# Patient Record
Sex: Male | Born: 1990 | Race: White | Hispanic: No | Marital: Single | Smoking: Never smoker
Health system: Southern US, Community
[De-identification: ages and names within clinical notes are randomized; demographics above are authoritative.]

## PROBLEM LIST (undated history)

## (undated) DIAGNOSIS — J45909 Unspecified asthma, uncomplicated: Secondary | ICD-10-CM

## (undated) DIAGNOSIS — Z8709 Personal history of other diseases of the respiratory system: Principal | ICD-10-CM

## (undated) HISTORY — DX: Personal history of other diseases of the respiratory system: Z87.09

## (undated) HISTORY — DX: Unspecified asthma, uncomplicated: J45.909

---
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
## 2010-10-08 ENCOUNTER — Encounter
Admission: RE | Admit: 2010-10-08 | Discharge: 2010-10-08 | Payer: Self-pay | Source: Home / Self Care | Attending: Allergy and Immunology | Admitting: Allergy and Immunology

## 2011-03-08 NOTE — H&P (Signed)
Alexander Allison, SERMON           ACCOUNT NO.:  [id number]   MEDICAL RECORD NO.:  [id number]          PATIENT TYPE:  INP   LOCATION:  1823                         FACILITY:  [facility]   PHYSICIAN:  Salvatore Decent. Cornelius Moras, M.D. DATE OF BIRTH:  Sep 11, 1991   DATE OF ADMISSION:  03/06/2005  DATE OF DISCHARGE:                                HISTORY & PHYSICAL   PRESENTING CHIEF COMPLAINT:  Shortness of breath.   HISTORY OF PRESENT ILLNESS:  The patient is a 20 year old healthy white male  who developed sudden onset of shortness of breath associated with left-sided  chest pain approximately 1 week ago. His symptoms have persisted despite  medical treatment for presumed allergies. The patient presented to the  emergency room today where chest x-ray was performed demonstrating 25-30%  left pneumothorax. The patient has no previous history of spontaneous  pneumothorax. The patient denies any recent history of trauma.   PAST MEDICAL HISTORY:  Asthma.   PAST SURGICAL HISTORY:  Tonsillectomy and adenoidectomy.   FAMILY HISTORY:  Noncontributory.   MEDICATIONS PRIOR TO ADMISSION:  Advair discus twice daily.  He has received  a Prednisone dose pack taper this week.   DRUG ALLERGIES:  SULFA.   PHYSICAL EXAM:  The patient is a healthy well-appearing white male in no  acute distress. Vital signs are stable. HEENT exam is unremarkable. The neck  is supple. There is no adenopathy. Breath sounds are mildly diminished on  the left side. No wheezes or rhonchi are noted. Cardiovascular exam is  notable for regular rate and rhythm. No murmurs or rubs are noted. The  abdomen is soft, nontender. The extremities warm and well-perfused.  Remainder of his physical exam is noncontributory.   DIAGNOSTIC TESTS:  Chest x-ray obtained today at [facility] was notable for a left 30% pneumothorax. No other abnormalities are  noted.   IMPRESSION:  Left spontaneous pneumothorax.   PLAN:  Chest tube  placement and hospital admission. I have discussed options  at length with Mr. Levinson and his parents. All their questions have been  addressed.      CHO/MEDQ  D:  03/06/2005  T:  03/06/2005  Job:  [id number]   cc:   Maryellen Pile, M.D.   Eileen Stanford, M.D.

## 2011-03-08 NOTE — Op Note (Signed)
Alexander Allison, Alexander Allison           ACCOUNT NO.:  [id number]   MEDICAL RECORD NO.:  [id number]          PATIENT TYPE:  INP   LOCATION:  2010                         FACILITY:  [facility]   PHYSICIAN:  Salvatore Decent. Cornelius Moras, M.D. DATE OF BIRTH:  May 02, 1991   DATE OF PROCEDURE:  03/06/2005  DATE OF DISCHARGE:                                 OPERATIVE REPORT   PREOPERATIVE DIAGNOSIS:  Left spontaneous pneumothorax.   POSTOPERATIVE DIAGNOSIS:  Left spontaneous pneumothorax.   PROCEDURE:  left chest tube placement.   SURGEON:  Salvatore Decent. Cornelius Moras, M.D.   ANESTHESIA:  Lidocaine 1% local with intravenous sedation.   DESCRIPTION OF PROCEDURE:  Following informed consent from the patient and  both of his parents, intravenous sedation is administered consisting of a  total of 4 mg of intravenous morphine sulfate and 4 mg of midazolam.  Continuous pulse oximetry monitoring is maintained. The patient's left  anterior chest is prepared with Betadine solution and draped in a sterile  manner. Then 1% lidocaine solution is utilized to anesthetize the skin and  subcutaneous tissues overlying the anterior axillary line in approximately  the 5th intercostal space. A small incision is made and through this  incision 1% lidocaine solution is administered directly into the intercostal  nerve bundle directly through the incision and one interspace above and one  interspace below. A 28-French chest tube is passed directly through the  incision into the left pleural space. The chest tube is fixed to the skin  with silk sutures and subsequently connected to close suction drainage  device. A dry sterile dressing is applied. The patient tolerated the  procedure well. There were no complications.      CHO/MEDQ  D:  03/06/2005  T:  03/07/2005  Job:  [id number]

## 2011-07-11 LAB — BASIC METABOLIC PANEL
BUN: 18
BUN: 6
CO2: 23
Calcium: 8.5
Chloride: 100
Chloride: 101
Creatinine, Ser: 0.92
Creatinine, Ser: 1.29
GFR calc Af Amer: >60
GFR calc Af Amer: >60
GFR calc non Af Amer: >60
Glucose, Bld: 99
Potassium: 3.3 — ABNORMAL LOW
Potassium: 4.8
Sodium: 130 — ABNORMAL LOW
Sodium: 136

## 2011-07-11 LAB — DIFFERENTIAL
Basophils Absolute: 0
Basophils Relative: 0
Eosinophils Absolute: 0.1
Eosinophils Relative: 1
Lymphocytes Relative: 23
Lymphs Abs: 1.1
Monocytes Absolute: 0.8
Monocytes Relative: 13 — ABNORMAL HIGH
Monocytes Relative: 16 — ABNORMAL HIGH
Neutro Abs: 3
Neutro Abs: 8.8 — ABNORMAL HIGH
Neutrophils Relative %: 60

## 2011-07-11 LAB — CBC
HCT: 43.7
MCHC: 34.4
MCV: 91.4
RBC: 4.76

## 2012-08-13 IMAGING — CR DG CHEST 2V
2 series · 2 of 2 positions shown · non-contrast
Comparison: 10/01/2007.

CLINICAL DATA: History of pneumothorax.  Shortness of breath with
left-sided chest pain.

CHEST - 2 VIEW

[view not recorded (1 of 2)]
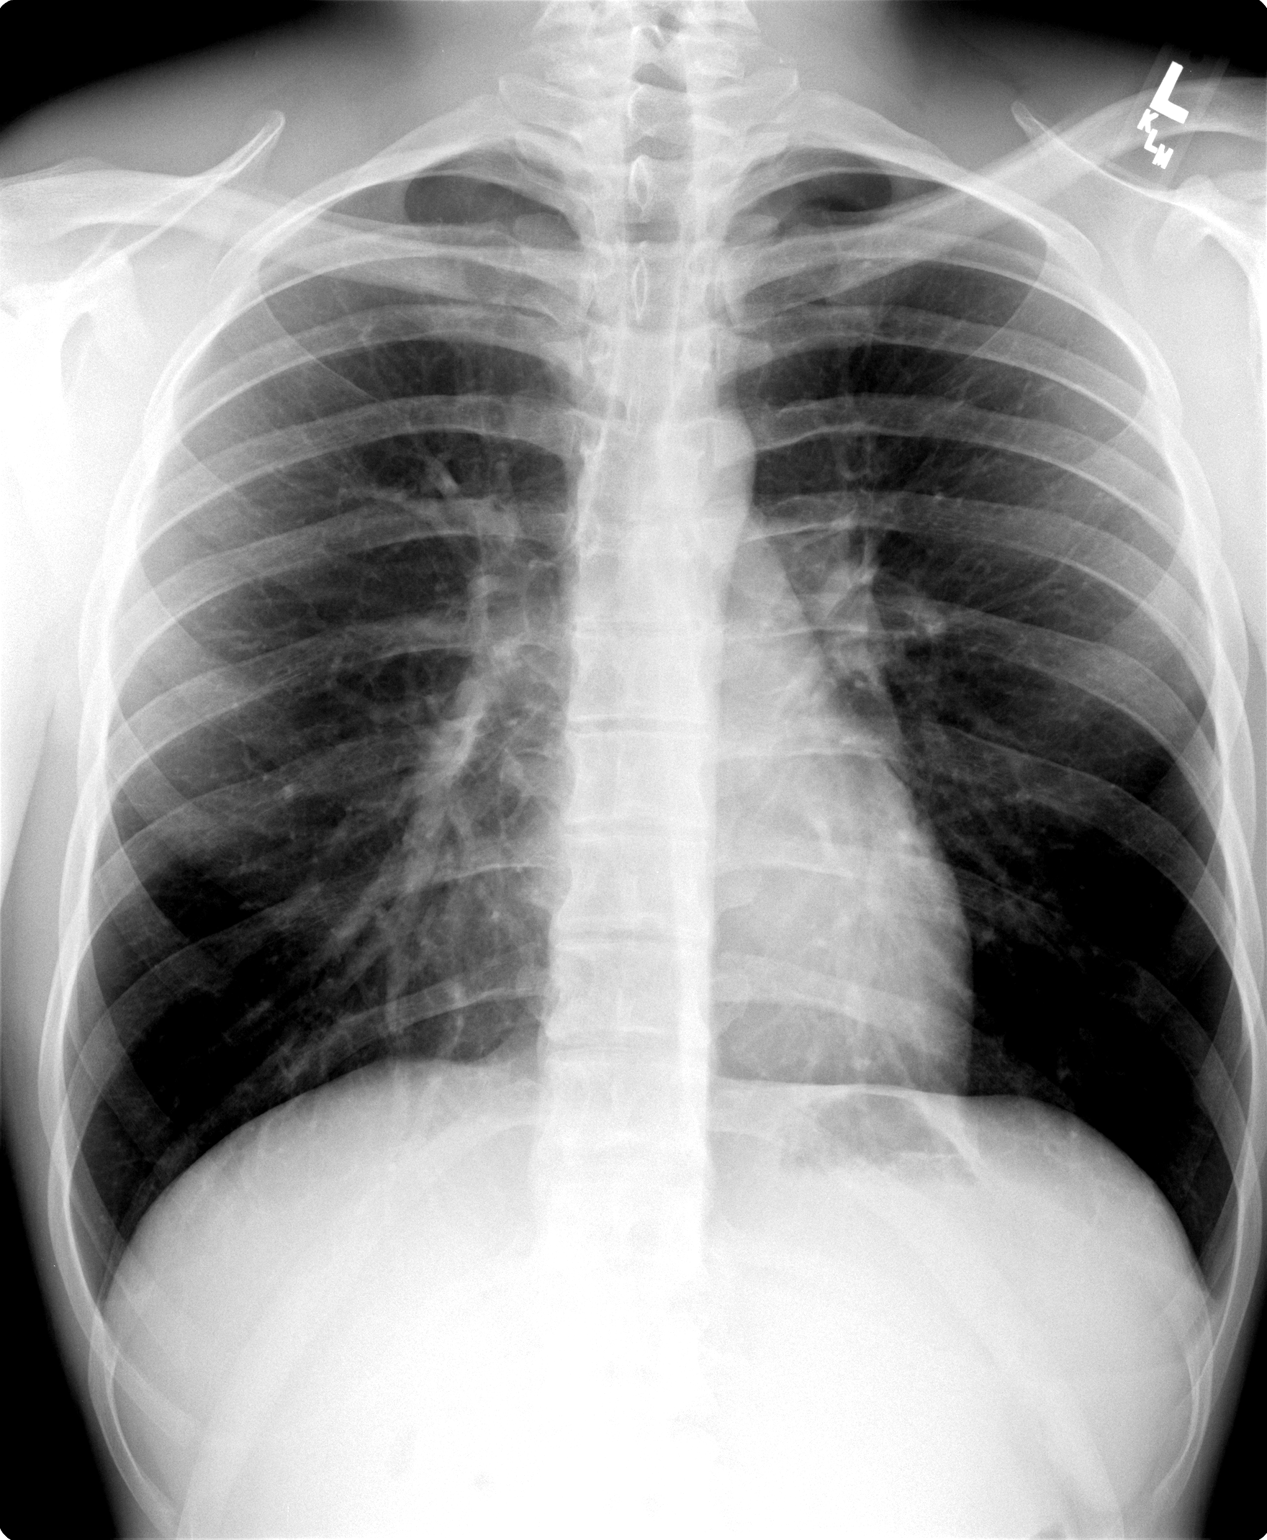

[view not recorded (2 of 2)]
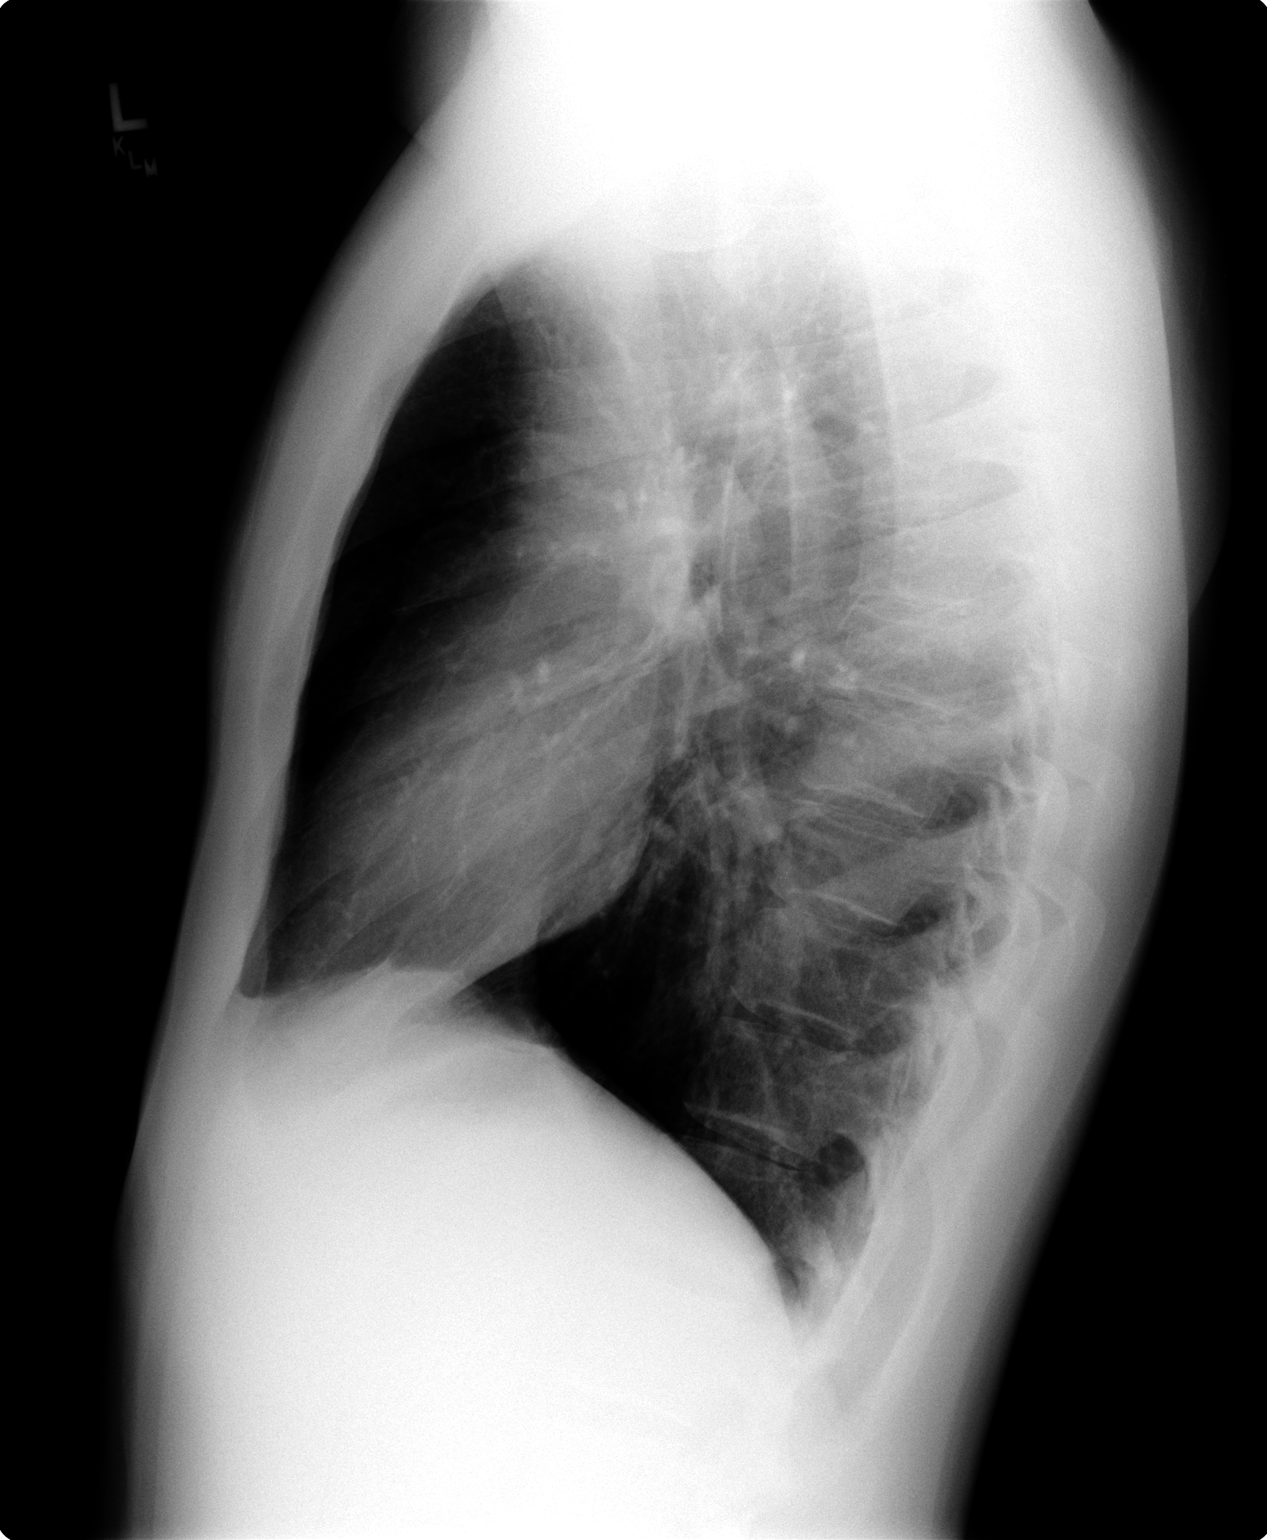

[2 of 2 positions shown; findings below may reference images not displayed]

FINDINGS: Trachea is midline.  Heart size normal.  Lungs are clear.
No pleural fluid.  There may be a very mild pectus deformity.
IMPRESSION: No acute findings.

## 2012-08-21 ENCOUNTER — Encounter: Payer: Self-pay | Admitting: Internal Medicine

## 2012-09-16 ENCOUNTER — Ambulatory Visit (INDEPENDENT_AMBULATORY_CARE_PROVIDER_SITE_OTHER): Payer: BC Managed Care – PPO | Admitting: Internal Medicine

## 2012-09-16 DIAGNOSIS — J45909 Unspecified asthma, uncomplicated: Secondary | ICD-10-CM

## 2012-09-16 DIAGNOSIS — A09 Infectious gastroenteritis and colitis, unspecified: Secondary | ICD-10-CM

## 2012-09-16 DIAGNOSIS — Z23 Encounter for immunization: Secondary | ICD-10-CM

## 2012-09-16 DIAGNOSIS — Z789 Other specified health status: Secondary | ICD-10-CM

## 2012-09-16 MED ORDER — ATOVAQUONE-PROGUANIL HCL 250-100 MG PO TABS: 1.0000 | ORAL_TABLET | Freq: Every day | ORAL | Status: DC

## 2012-09-16 MED ORDER — CIPROFLOXACIN HCL 500 MG PO TABS: 500.0000 mg | ORAL_TABLET | Freq: Two times a day (BID) | ORAL | Status: DC

## 2012-09-16 MED ORDER — ALBUTEROL SULFATE HFA 108 (90 BASE) MCG/ACT IN AERS: 2.0000 | INHALATION_SPRAY | Freq: Four times a day (QID) | RESPIRATORY_TRACT | Status: DC | PRN

## 2012-09-16 MED ORDER — TYPHOID VACCINE PO CPDR: 1.0000 | DELAYED_RELEASE_CAPSULE | ORAL | Status: DC

## 2012-09-21 NOTE — Progress Notes (Signed)
[facility] TRAVEL [facility]INIC NOTE  RFV: pre travel counseling and vaccination for trip to [place] Subjective:    Patient ID: Alexander Allison, male    DOB: 05-02-1991, 21 y.o.   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     Review of Systems     Objective:   Physical Exam        Assessment & Plan:  Malaria prophylaxis = gave malarone, info on mosquito bite prevention, DEET and premethrin  Traveler's diarrhea= gave rx for cipro  Pre-travel vaccine = gave oral typhoid vaccine, and yellow fever vaccine

## 2013-04-14 ENCOUNTER — Other Ambulatory Visit: Payer: Self-pay | Admitting: *Deleted

## 2013-04-14 DIAGNOSIS — R079 Chest pain, unspecified: Secondary | ICD-10-CM

## 2013-04-16 ENCOUNTER — Ambulatory Visit
Admission: RE | Admit: 2013-04-16 | Discharge: 2013-04-16 | Disposition: A | Discharge status: Home / Self Care | Payer: BC Managed Care – PPO | Source: Ambulatory Visit | Attending: Thoracic Surgery (Cardiothoracic Vascular Surgery) | Admitting: Thoracic Surgery (Cardiothoracic Vascular Surgery)

## 2013-04-16 ENCOUNTER — Ambulatory Visit (INDEPENDENT_AMBULATORY_CARE_PROVIDER_SITE_OTHER): Payer: BC Managed Care – PPO | Admitting: Thoracic Surgery (Cardiothoracic Vascular Surgery)

## 2013-04-16 ENCOUNTER — Encounter: Payer: Self-pay | Admitting: Thoracic Surgery (Cardiothoracic Vascular Surgery)

## 2013-04-16 VITALS — BP 125/69 | HR 62 | Resp 16 | Ht 71.0 in | Wt 165.0 lb

## 2013-04-16 DIAGNOSIS — Z8709 Personal history of other diseases of the respiratory system: Secondary | ICD-10-CM

## 2013-04-16 DIAGNOSIS — R079 Chest pain, unspecified: Secondary | ICD-10-CM

## 2013-04-16 DIAGNOSIS — J45909 Unspecified asthma, uncomplicated: Secondary | ICD-10-CM | POA: Insufficient documentation

## 2013-04-16 NOTE — Progress Notes (Signed)
PCP is No primary provider on file. Referring Provider is No ref. provider found  Chief Complaint  Patient presents with  . Chest Pain    has hx of previous left ptx with chest tube placement 03/06/05 per Dr. Cornelius Moras.  States the side has been uncomfortable ever since the surgery    HPI: Alexander Allison is a 22 year old male with a history of a left-sided spontaneous pneumothorax treated with a chest tube in 2006. He says that he has been uncomfortable on that side ever since the procedure. He says that he has pain particularly if he lies on his left side at night. He will wake up with a burning, achy pain on his left chest around the area of the scar and he feels short of breath as well. He does not experience the pain if he lies on his right side. He also notices left-sided chest pain and shortness of breath at times when exercising. He says that the pain is about 3 on a scale of 1-10. He does not feel that he is wheezing or having an asthma attack. His main concern is to make sure his lung is not collapsed again or that he is doing something to hurt himself.   Past Medical History  Diagnosis Date  . H/O: pneumothorax 03/06/2005    LEFT  . Asthma     Past Surgical History  Procedure Laterality Date  . Chest tube insertion  03/06/05    LEFT PNEUMOTHORAX    No family history on file.  Social History History  Substance Use Topics  . Smoking status: Never Smoker   . Smokeless tobacco: Never Used  . Alcohol Use: Yes     Comment: 2-4 beers per week    No current outpatient prescriptions on file.   No current facility-administered medications for this visit.    Not on File  Review of Systems  Respiratory: Positive for shortness of breath.   Cardiovascular: Positive for chest pain.  All other systems reviewed and are negative.    BP 125/69  Pulse 62  Resp 16  Ht 5\' 11"  (1.803 m)  Wt 165 lb (74.844 kg)  BMI 23.02 kg/m2  SpO2 98% Physical Exam  Vitals reviewed. Constitutional: He  is oriented to person, place, and time. He appears well-developed and well-nourished. No distress.  HENT:  Head: Normocephalic and atraumatic.  Eyes: EOM are normal. Pupils are equal, round, and reactive to light.  Neck: Neck supple. No thyromegaly present.  Cardiovascular: Normal rate and regular rhythm.  Exam reveals no gallop and no friction rub.   No murmur heard. Pulmonary/Chest: Effort normal and breath sounds normal. He has no wheezes. He has no rales.  Abdominal: Soft. There is no tenderness.  Musculoskeletal: He exhibits no edema.  Lymphadenopathy:    He has no cervical adenopathy.  Neurological: He is alert and oriented to person, place, and time. No cranial nerve deficit.  Skin: Skin is warm and dry.     Diagnostic Tests: Chest x-ray 04/16/2013 *RADIOLOGY REPORT*  Clinical Data: Chest pain  CHEST - 2 VIEW  Comparison: October 08, 2010.  Findings: Cardiomediastinal silhouette appears normal. No acute  pulmonary disease is noted. Bony thorax is intact.  IMPRESSION:  No acute cardiopulmonary abnormality seen.  Original Report Authenticated By: Lupita Raider., M.D.  Impression: 22 year old gentleman with a history of left-sided spontaneous pneumothorax who has persistent left-sided chest pain in the vicinity of the scar. This pain is associated with some sensation of shortness of breath  or catching in his breathing. He has had this sensation ever since his chest tube 8 years ago. A chest x-ray today shows no pathology. I suspect that ,given the focal nature of the pain and its relation to his chest tube site, that he has an intercostal neuralgia.  I reassured him that he was not hurting himself by exerting himself or lying on his left side. However he may experience pain when he does those things. I'm not sure there is that much we can do about it this far out as it is a very chronic problem. I recommended that he try a about a 1 month course of  Aleve twice daily to see if  that makes any difference. Another option would be gabapentin but I really don't want to give him a chronic medication at his age unless the pain progresses to the point that it interferes with his activities.  Plan: 1 month course of Aleve  Patient knows to call if he experiences worsening pain or other concerning symptoms.

## 2014-05-05 ENCOUNTER — Emergency Department (HOSPITAL_COMMUNITY)
Admission: EM | Admit: 2014-05-05 | Discharge: 2014-05-06 | Disposition: A | Discharge status: Home / Self Care | Payer: BC Managed Care – PPO | Attending: Emergency Medicine | Admitting: Emergency Medicine

## 2014-05-05 ENCOUNTER — Encounter (HOSPITAL_COMMUNITY): Payer: Self-pay | Admitting: Emergency Medicine

## 2014-05-05 DIAGNOSIS — Y9389 Activity, other specified: Secondary | ICD-10-CM | POA: Insufficient documentation

## 2014-05-05 DIAGNOSIS — T7840XA Allergy, unspecified, initial encounter: Secondary | ICD-10-CM

## 2014-05-05 DIAGNOSIS — Z79899 Other long term (current) drug therapy: Secondary | ICD-10-CM | POA: Insufficient documentation

## 2014-05-05 DIAGNOSIS — Y929 Unspecified place or not applicable: Secondary | ICD-10-CM | POA: Insufficient documentation

## 2014-05-05 DIAGNOSIS — T628X1A Toxic effect of other specified noxious substances eaten as food, accidental (unintentional), initial encounter: Secondary | ICD-10-CM | POA: Insufficient documentation

## 2014-05-05 DIAGNOSIS — J45909 Unspecified asthma, uncomplicated: Secondary | ICD-10-CM | POA: Insufficient documentation

## 2014-05-05 DIAGNOSIS — L299 Pruritus, unspecified: Secondary | ICD-10-CM | POA: Insufficient documentation

## 2014-05-05 DIAGNOSIS — R1310 Dysphagia, unspecified: Secondary | ICD-10-CM | POA: Insufficient documentation

## 2014-05-05 MED ORDER — EPINEPHRINE 0.3 MG/0.3ML IJ SOAJ
0.3000 mg | Freq: Once | INTRAMUSCULAR | Status: AC
  Administered 2014-05-05: 0.3 mg via INTRAMUSCULAR

## 2014-05-05 MED ORDER — EPINEPHRINE 0.3 MG/0.3ML IJ SOAJ: 0.3000 mg | INTRAMUSCULAR | Status: AC | PRN

## 2014-05-05 MED ORDER — EPINEPHRINE 0.3 MG/0.3ML IJ SOAJ
INTRAMUSCULAR | Status: AC
  Filled 2014-05-05: qty 0.3

## 2014-05-05 MED ORDER — DIPHENHYDRAMINE HCL 25 MG PO TABS: 25.0000 mg | ORAL_TABLET | Freq: Four times a day (QID) | ORAL | Status: DC

## 2014-05-05 MED ORDER — FAMOTIDINE IN NACL 20-0.9 MG/50ML-% IV SOLN
20.0000 mg | Freq: Once | INTRAVENOUS | Status: AC
  Administered 2014-05-05: 20 mg via INTRAVENOUS
  Filled 2014-05-05: qty 50

## 2014-05-05 MED ORDER — DIPHENHYDRAMINE HCL 50 MG/ML IJ SOLN
12.5000 mg | Freq: Once | INTRAMUSCULAR | Status: AC
  Administered 2014-05-05: 12.5 mg via INTRAVENOUS
  Filled 2014-05-05: qty 1

## 2014-05-05 MED ORDER — METHYLPREDNISOLONE SODIUM SUCC 125 MG IJ SOLR
125.0000 mg | Freq: Once | INTRAMUSCULAR | Status: AC
  Administered 2014-05-05: 125 mg via INTRAVENOUS
  Filled 2014-05-05: qty 2

## 2014-05-05 MED ORDER — PREDNISONE 10 MG PO TABS: 50.0000 mg | ORAL_TABLET | Freq: Every day | ORAL | Status: DC

## 2014-05-05 NOTE — ED Notes (Signed)
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 Had episode where he had to be treated here for it six years ago. Thinks he may have been exposure tonight but unsure. Pt reports that he has throat tingling and issues with swallowing.

## 2014-05-05 NOTE — ED Provider Notes (Signed)
CSN: [id number]     Arrival date & time 05/05/14  2015 History   First MD Initiated Contact with Patient 05/05/14 2043     Chief Complaint  Patient presents with  . Oral Swelling     (Consider location/radiation/quality/duration/timing/severity/associated sxs/prior Treatment) Patient is a 23 y.o. male presenting with allergic reaction.  Allergic Reaction Presenting symptoms: difficulty breathing, difficulty swallowing and itching   Presenting symptoms: no rash, no swelling and no wheezing   Itching:    Location:  Mouth   Severity:  Mild Severity:  Mild Prior allergic episodes:  Food/nut allergies Context: nuts   Relieved by:  None tried Worsened by:  Nothing tried   Past Medical History  Diagnosis Date  . H/O: pneumothorax 03/06/2005    LEFT  . Asthma    Past Surgical History  Procedure Laterality Date  . Chest tube insertion  03/06/05    LEFT PNEUMOTHORAX   History reviewed. No pertinent family history. History  Substance Use Topics  . Smoking status: Never Smoker   . Smokeless tobacco: Never Used  . Alcohol Use: Yes     Comment: 2-4 beers per week    Review of Systems  Constitutional: Negative for fever and chills.  HENT: Positive for trouble swallowing. Negative for sore throat.   Eyes: Negative for pain.  Respiratory: Negative for cough, shortness of breath and wheezing.   Cardiovascular: Negative for chest pain.  Gastrointestinal: Negative for nausea, vomiting and abdominal pain.  Genitourinary: Negative for dysuria and flank pain.  Musculoskeletal: Negative for back pain and neck pain.  Skin: Positive for itching. Negative for rash.  Neurological: Negative for seizures and headaches.      Allergies  Pine and Sulfa antibiotics  Home Medications   Prior to Admission medications   Medication Sig Start Date End Date Taking? Authorizing Provider  albuterol (PROVENTIL HFA;VENTOLIN HFA) 108 (90 BASE) MCG/ACT inhaler Inhale 2 puffs into the lungs every 6  (six) hours as needed for shortness of breath.   Yes Historical Provider, MD  Fexofenadine HCl (ALLEGRA PO) Take 1 tablet by mouth daily as needed.   Yes Historical Provider, MD  diphenhydrAMINE (BENADRYL) 25 MG tablet Take 1 tablet (25 mg total) by mouth every 6 (six) hours. 05/05/14   Imagene ShellerSteve Mccall Lomax, MD  EPINEPHrine (EPIPEN) 0.3 mg/0.3 mL IJ SOAJ injection Inject 0.3 mLs (0.3 mg total) into the muscle as needed. 05/05/14   Imagene ShellerSteve Shaka Cardin, MD  predniSONE (DELTASONE) 10 MG tablet Take 5 tablets (50 mg total) by mouth daily. 05/05/14   Imagene ShellerSteve Kieon Lawhorn, MD   BP 129/68  Pulse 84  Temp(Src) 98.5 F (36.9 C) (Oral)  Resp 14  SpO2 98% Physical Exam  Constitutional: He is oriented to person, place, and time. He appears well-developed and well-nourished. No distress.  HENT:  Head: Normocephalic and atraumatic.  Mouth/Throat: No oral lesions. No uvula swelling. No posterior oropharyngeal edema or posterior oropharyngeal erythema.  Eyes: Pupils are equal, round, and reactive to light.  Neck: Normal range of motion.  Cardiovascular: Normal rate and regular rhythm.   Pulmonary/Chest: Effort normal and breath sounds normal.  Abdominal: Soft. He exhibits no distension. There is no tenderness.  Musculoskeletal: Normal range of motion.  Neurological: He is alert and oriented to person, place, and time.  Skin: Skin is warm. He is not diaphoretic.    ED Course  Procedures (including critical care time) Labs Review Labs Reviewed - No data to display  Imaging Review No results found.   EKG Interpretation  None      MDM   Final diagnoses:  Allergic reaction, initial encounter   23 year old male with a history of allergies to pine nuts presents today after having a bite of pesto pasta, and a meal he began feeling some tingling in the back of his throat.  On arrival the patient is in no acute distress. He is hemodynamically stable. He's not having any difficulty breathing. Exam as above demonstrates no  urticaria, no oropharyngeal swelling, tongue swelling, lip swelling, wheezing.  Given the patient's severe reaction to pine nuts in the past, plan is to treat him as an anaphylactic patient. Patient was given epinephrine, steroids, Benadryl, Prevacid.  Patient observed over the course of 3 hours. Patient had improved symptoms with no worsening shortness of breath, wheezing. Patient appears stable and is comfortable. Upon reassessment after 3 hours, the patient is stable appearing and feels ready to go home at this time. Prescription given for steroids and Benadryl over the course of the next 5 days with a prescription for EpiPen. Patient and family the bedside with understanding of the plan to followup with his regular care physician as needed. EpiPen instructions given. Strict return precautions were given. Patient was seen and evaluated by myself and by the attending Dr. Rosalia Hammers. Patient discharged in stable condition.   Imagene Sheller, MD 05/06/14 [phone]

## 2014-05-06 NOTE — ED Provider Notes (Signed)
23 y.o. Male with history of pine nut allergy who began having some itching after eating food that turned out to have pesto in it.  Patient only took one bite.  He came directly to ed as his epi pen was out of date.  He reports that the only previous episode occurred several years ago and involved an episode of hypotension.  Patient treated and observed here for three hours without evidence of worsening symptoms.  Patient advised of need for epi pen and return if any return of symptoms.   I performed a history and physical examination of Hannah BeatHarrison T Pense and discussed his management with Dr. Gordy LevanWalton.  I agree with the history, physical, assessment, and plan of care, with the following exceptions: None  I was present for the following procedures: None Time Spent in Critical Care of the patient: None Time spent in discussions with the patient and family: 512  Rick Warnick S    Hilario Quarryanielle S Loren Vicens, MD 05/06/14 1622

## 2015-02-20 IMAGING — CR DG CHEST 2V
2 series · 2 of 2 positions shown · non-contrast
Comparison: October 08, 2010.

CLINICAL DATA: Chest pain

CHEST - 2 VIEW

[w chest pa]
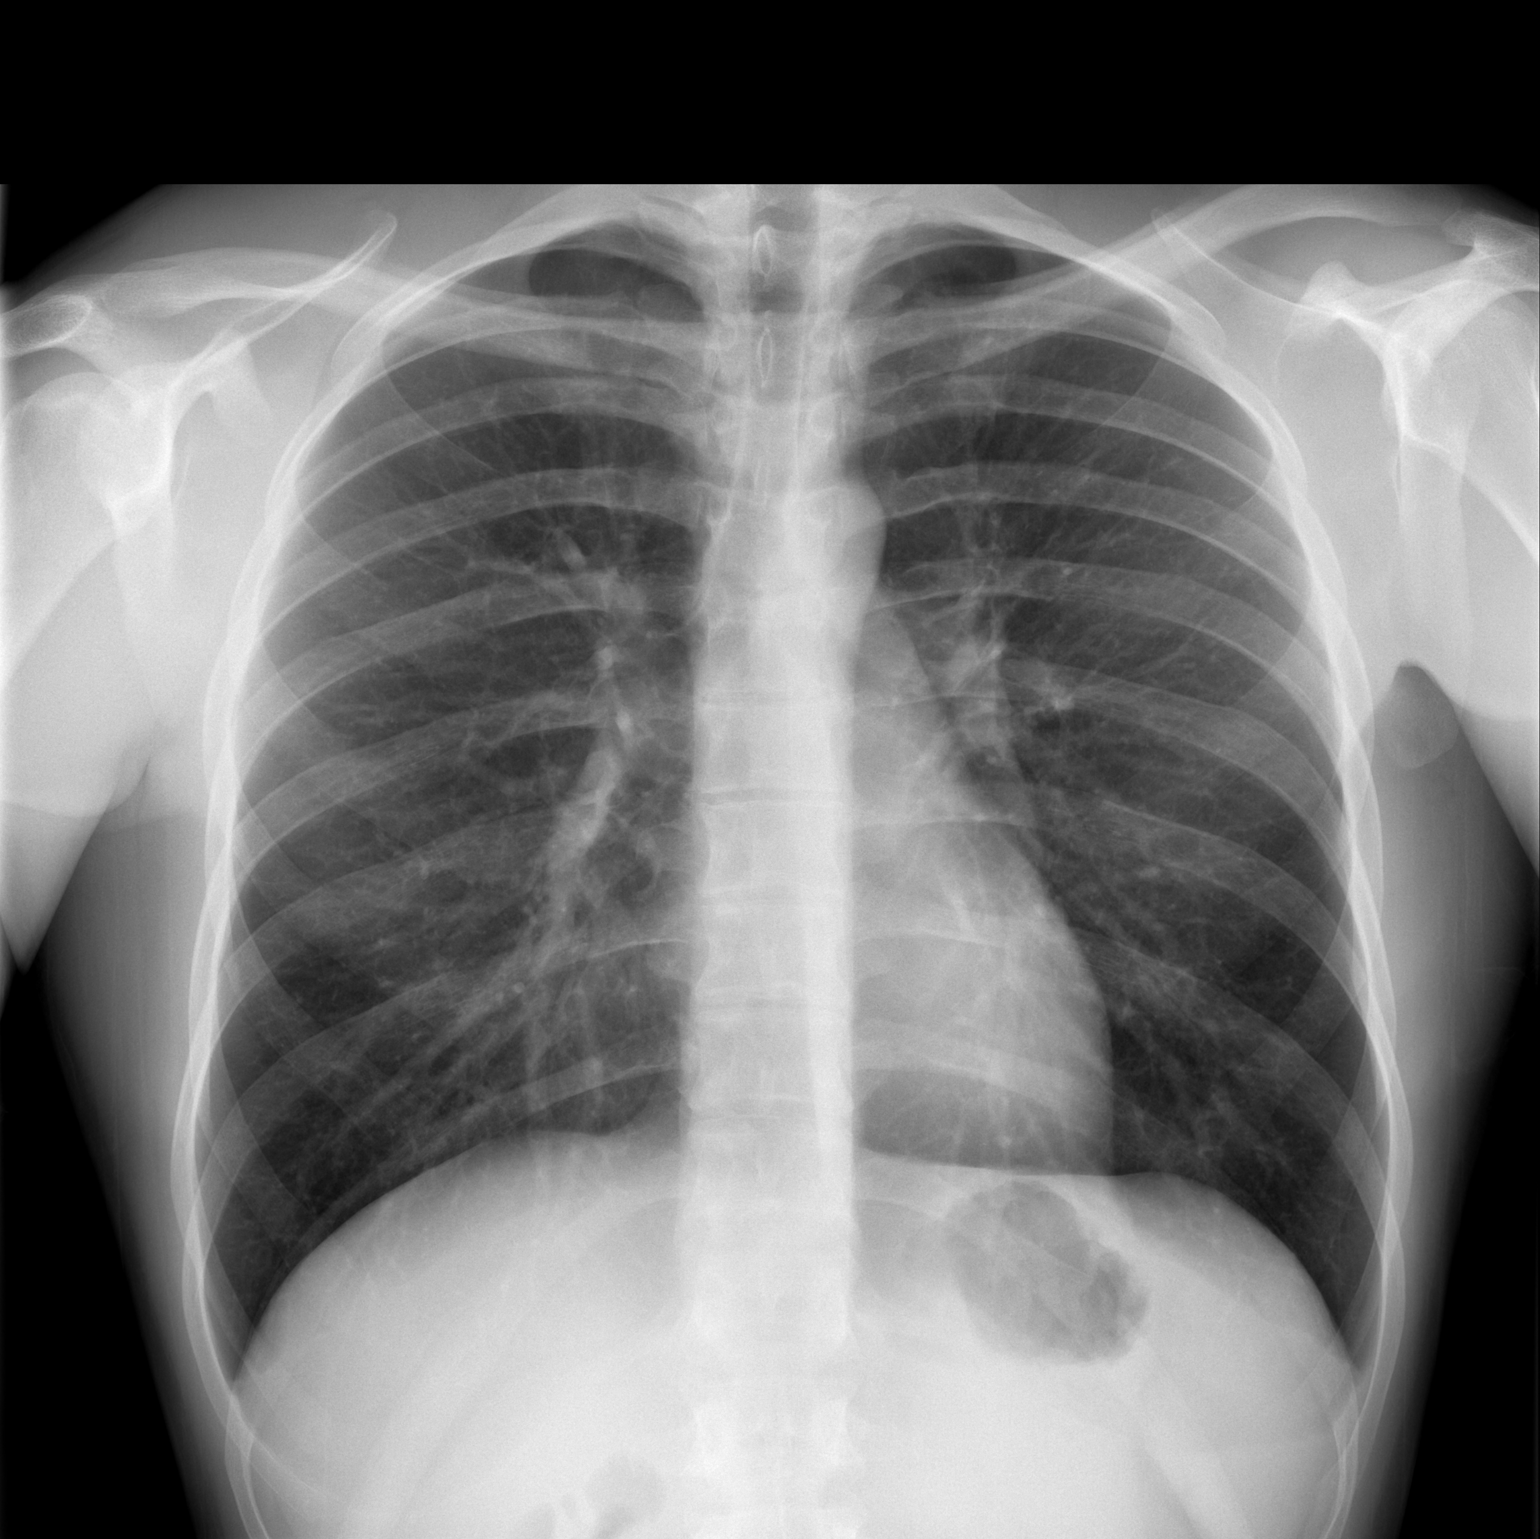

[w chest lat]
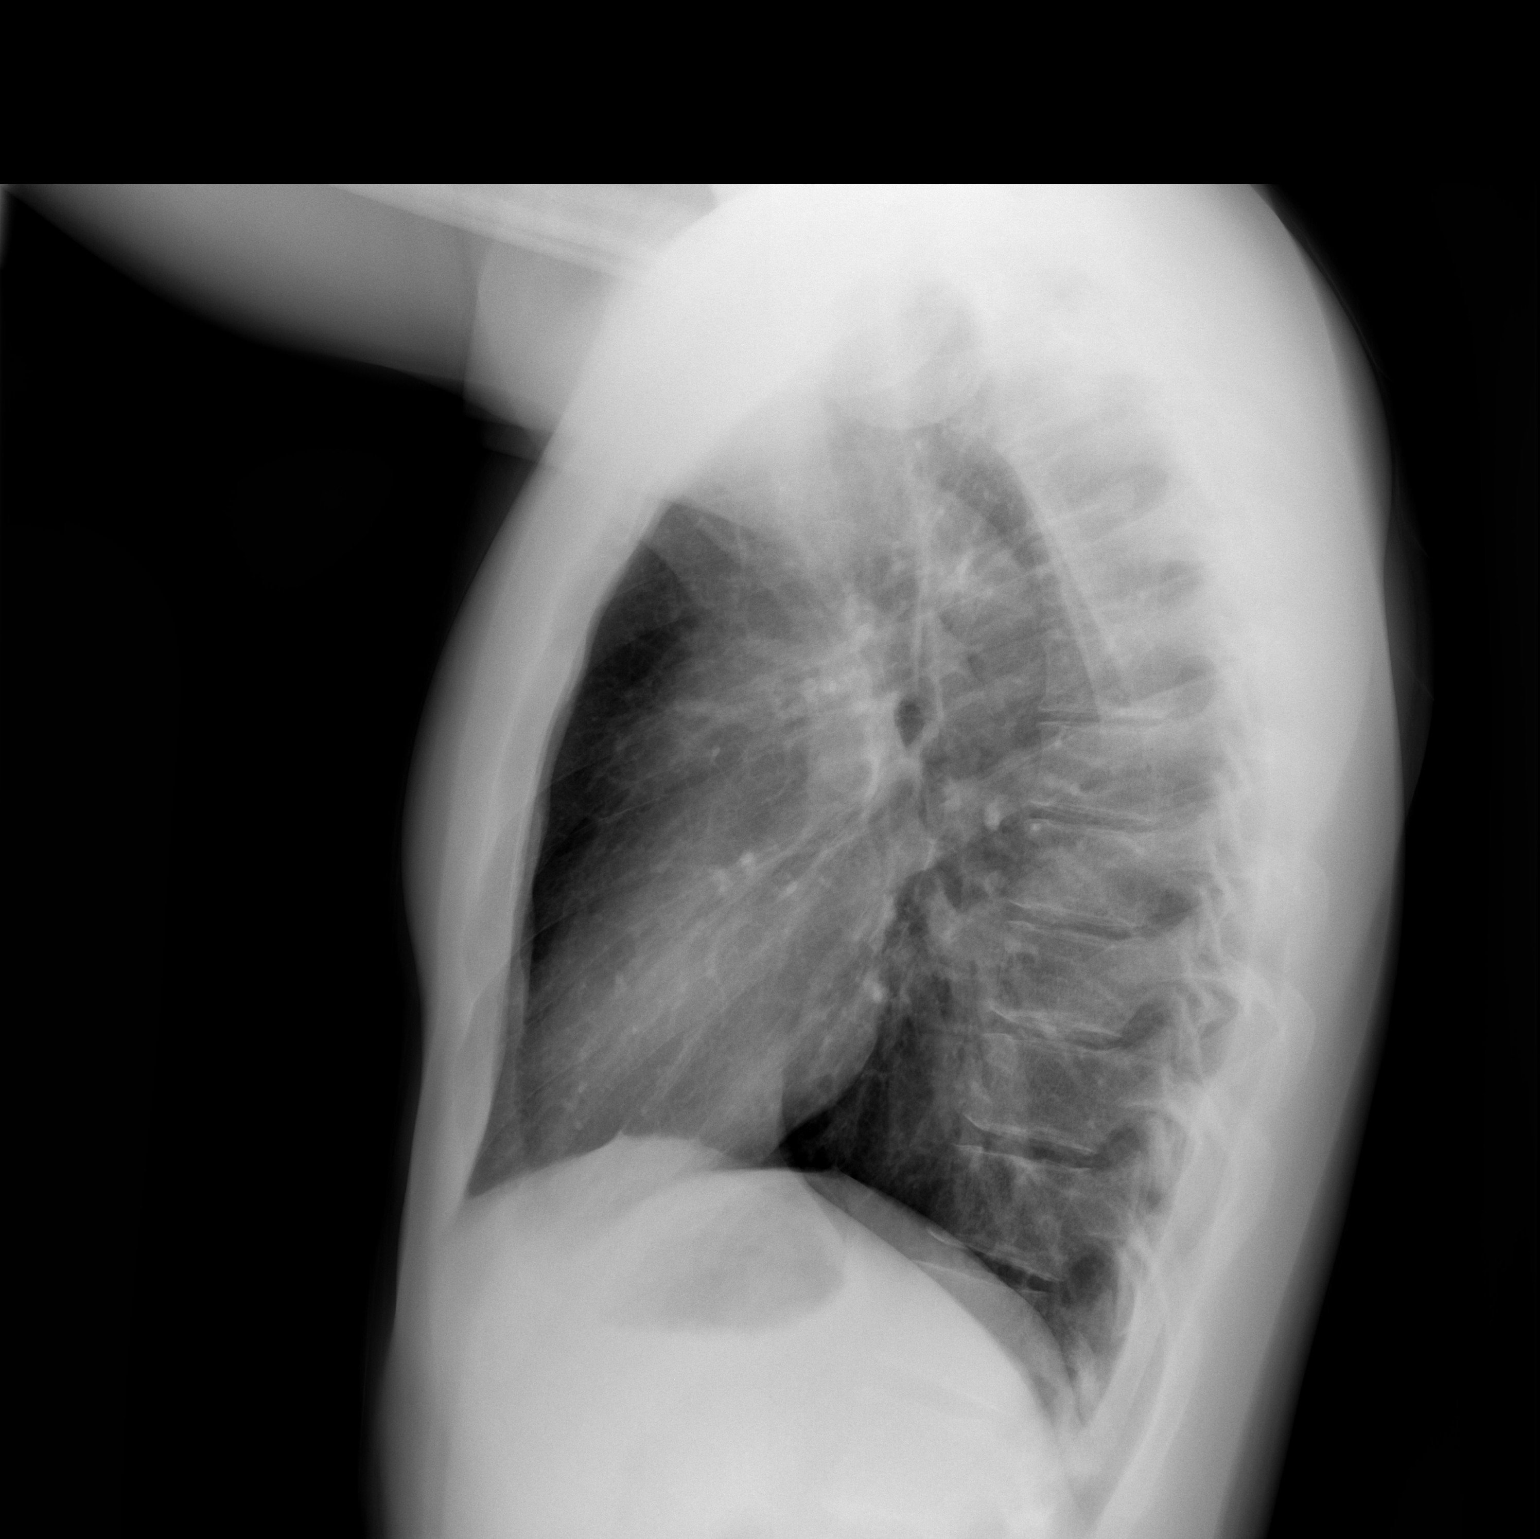

[2 of 2 positions shown; findings below may reference images not displayed]

FINDINGS: Cardiomediastinal silhouette appears normal.  No acute
pulmonary disease is noted.  Bony thorax is intact.
IMPRESSION: No acute cardiopulmonary abnormality seen.

## 2020-11-30 ENCOUNTER — Telehealth: Payer: Self-pay

## 2020-11-30 NOTE — Telephone Encounter (Signed)
Received voicemail from patient, he states he was a patient of Dr. Feliz Beam and that he needs to see her in regards to some thrush that he has had for the past 3 years. Per chart, patient was here for travel clinic back in 2013. RN returned patient's call, no answer. Left HIPAA compliant voicemail requesting callback.   Sandie Ano, RN

## 2020-12-01 DIAGNOSIS — J309 Allergic rhinitis, unspecified: Secondary | ICD-10-CM | POA: Diagnosis not present

## 2020-12-01 DIAGNOSIS — J45909 Unspecified asthma, uncomplicated: Secondary | ICD-10-CM | POA: Diagnosis not present

## 2020-12-01 DIAGNOSIS — K219 Gastro-esophageal reflux disease without esophagitis: Secondary | ICD-10-CM | POA: Diagnosis not present

## 2020-12-01 DIAGNOSIS — B370 Candidal stomatitis: Secondary | ICD-10-CM | POA: Diagnosis not present

## 2020-12-06 DIAGNOSIS — J3089 Other allergic rhinitis: Secondary | ICD-10-CM | POA: Diagnosis not present

## 2020-12-06 DIAGNOSIS — J4540 Moderate persistent asthma, uncomplicated: Secondary | ICD-10-CM | POA: Diagnosis not present

## 2020-12-06 DIAGNOSIS — J301 Allergic rhinitis due to pollen: Secondary | ICD-10-CM | POA: Diagnosis not present

## 2020-12-06 DIAGNOSIS — J3081 Allergic rhinitis due to animal (cat) (dog) hair and dander: Secondary | ICD-10-CM | POA: Diagnosis not present

## 2020-12-06 DIAGNOSIS — Z91018 Allergy to other foods: Secondary | ICD-10-CM | POA: Diagnosis not present

## 2020-12-07 NOTE — Telephone Encounter (Signed)
Patient has an appointment with Dr Renold Don on 12-13-20

## 2020-12-07 NOTE — Telephone Encounter (Signed)
Received call from patient's mother. She states her son has recently moved back into town from [place]. [place]. He previously saw Dr. Drue Second as a travel clinic patient. 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 He was being managed by an infectious disease provider in [place], the family would prefer to see Dr. Drue Second. RN advised patient's mother to request a referral from patient's [place] ID doctor. She verbalized understanding and has no further questions.   Sandie Ano, RN

## 2020-12-13 ENCOUNTER — Encounter: Payer: Self-pay | Admitting: Internal Medicine

## 2020-12-13 ENCOUNTER — Ambulatory Visit: Payer: BC Managed Care – PPO | Admitting: Internal Medicine

## 2020-12-13 ENCOUNTER — Ambulatory Visit: Payer: Self-pay | Admitting: Internal Medicine

## 2020-12-13 ENCOUNTER — Other Ambulatory Visit: Payer: Self-pay

## 2020-12-13 VITALS — BP 124/85 | HR 105 | Temp 97.7°F | Wt 185.0 lb

## 2020-12-13 DIAGNOSIS — J4520 Mild intermittent asthma, uncomplicated: Secondary | ICD-10-CM

## 2020-12-13 DIAGNOSIS — B370 Candidal stomatitis: Secondary | ICD-10-CM

## 2020-12-13 NOTE — Progress Notes (Signed)
RFV: new appt -recurrent thrush  Patient ID: Alexander Allison, male   DOB: 06/01/1991, 30 y.o.   MRN: [id number]  HPI Alexander Allison is a 30yo M with history of childhood asthma, and most recently 3 year history of recurrent thrush. 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   The cycle for the last 3 years is that feels thrush, then given fluconazole finishes 21 days has temp relief 3-4 wks of respite, then comes back. " I have had it a tons a times". Started a week ago of restarting course  Saw ID doc [facility], moved before able to do sampling for fluconazole resistance  Thought to be due to allergy medication vs high dose inhaler draining down throat, steroid that place him at risk for recurrent thrush. Never solved mystery.  Needs advair for asthma. He feels that fluconazole helps a few days but then re-occurrence. He has had extensive work up including Endoscopy where candidal patches seen in esophagus, ENT scope. He feels that it Takes 4-5 days to move from throat to mouth.  He has had immunodeficiency work up to rule out causes for susceptibility  Started up seeing back with Dr Orvil Feil, his all/imms physician  Has tried to reduce from 2 to 1 puff bid of inhaler --but starts feeling symptomatic. Has exercise induced asthma.   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 Is married   Outpatient Encounter Medications as of 12/13/2020  Medication Sig  . albuterol (PROVENTIL HFA;VENTOLIN HFA) 108 (90 BASE) MCG/ACT inhaler Inhale 2 puffs into the lungs every 6 (six) hours as needed for shortness of breath.  . cetirizine (ZYRTEC) 10 MG tablet Take 10 mg by mouth daily.  . diphenhydrAMINE (BENADRYL) 25 MG tablet Take 1 tablet (25 mg total) by mouth every 6 (six) hours.  Marland Kitchen EPINEPHrine (EPIPEN) 0.3 mg/0.3 mL IJ SOAJ injection Inject 0.3 mLs (0.3 mg total) into the muscle as needed.  . fluconazole (DIFLUCAN) 200 MG tablet Take 200 mg by mouth daily.  .  montelukast (SINGULAIR) 10 MG tablet Take 10 mg by mouth daily.  . nortriptyline (PAMELOR) 75 MG capsule TAKE 1 CAPSULE(75 MG) BY MOUTH DAILY AT BEDTIME  . omeprazole (PRILOSEC) 20 MG capsule Take 20 mg by mouth daily.  Marland Kitchen Fexofenadine HCl (ALLEGRA PO) Take 1 tablet by mouth daily as needed. (Patient not taking: Reported on 12/13/2020)  . predniSONE (DELTASONE) 10 MG tablet Take 5 tablets (50 mg total) by mouth daily. (Patient not taking: Reported on 12/13/2020)   No facility-administered encounter medications on file as of 12/13/2020.     Patient Active Problem List   Diagnosis Date Noted  . Asthma   . H/O: pneumothorax   s/p appendix c/b hematoma ? Abdominal wall -2021 GERD    Health Maintenance Due  Topic Date Due  . Hepatitis C Screening  Never done  . HIV Screening  Never done  . TETANUS/TDAP  Never done     Review of Systems Athlete's foot occasionally. 12 point ros is negative except what is mentioned above  Physical Exam   BP 124/85   Pulse (!) 105   Temp 97.7 F (36.5 C) (Oral)   Wt 185 lb (83.9 kg)   BMI 25.80 kg/m   Physical Exam  Constitutional:  oriented to person, place, and time. appears well-developed and well-nourished. No distress.  HENT: [place]/AT, PERRLA, no scleral icterus Mouth/Throat: Oropharynx is clear and moist. No oropharyngeal exudate.  Cardiovascular:  Normal rate, regular rhythm and normal heart sounds. Exam reveals no gallop and no friction rub.  No murmur heard.  Pulmonary/Chest: Effort normal and breath sounds normal. No respiratory distress.  has no wheezes.  Neck = supple, no nuchal rigidity Lymphadenopathy: no cervical adenopathy. No axillary adenopathy Neurological: alert and oriented to person, place, and time.  Skin: Skin is warm and dry. No rash noted. No erythema.  Psychiatric: a normal mood and affect.  behavior is normal.   CBC Lab Results  Component Value Date   WBC 4.9 10/31/2007   RBC 4.76 10/31/2007   HGB 15.3 10/31/2007    HCT 43.7 10/31/2007   PLT 201 DELTA CHECK NOTED 10/31/2007   MCV 91.8 10/31/2007   MCHC 35.1 10/31/2007   RDW 12.3 10/31/2007   LYMPHSABS 1.1 10/31/2007   MONOABS 0.8 10/31/2007   EOSABS 0.1 10/31/2007    BMET Lab Results  Component Value Date   NA 130 (L) 10/31/2007   K 3.3 RESULT REPEATED AND VERIFIED (L) 10/31/2007   CL 100 10/31/2007   CO2 23 10/31/2007   GLUCOSE 99 10/31/2007   BUN 6 DELTA CHECK NOTED 10/31/2007   CREATININE 0.92 10/31/2007   CALCIUM 8.5 10/31/2007   GFRNONAA >60 10/31/2007   GFRAA  10/31/2007    >60        The eGFR has been calculated using the MDRD equation. This calculation has not been validated in all clinical situations. eGFR's persistently <60 mL/min signify possible Chronic Kidney Disease.    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 Based on his report, his previous providers in [facility] were going to do fungal culture and resistance testing to determine how to best treat and limit recurrence.  I believe that this makes the most sense for him to let us know when he has recurrence, bring him in for testing. May need echinocandins to treat if fluconazole resistant. My concern is that the underlying cause that places him at risk for recurrence are his steroid inhalers which appears he is dependent.   For now, plan to track recurrences in order to understand frequency and treatment plan.  Spent 45 min with patient review records and discussing treatment plan

## 2020-12-13 NOTE — Patient Instructions (Signed)
Can text me when thrush comes on [phone]

## 2021-01-01 DIAGNOSIS — J3081 Allergic rhinitis due to animal (cat) (dog) hair and dander: Secondary | ICD-10-CM | POA: Diagnosis not present

## 2021-01-01 DIAGNOSIS — Z91018 Allergy to other foods: Secondary | ICD-10-CM | POA: Diagnosis not present

## 2021-01-01 DIAGNOSIS — J301 Allergic rhinitis due to pollen: Secondary | ICD-10-CM | POA: Diagnosis not present

## 2021-01-01 DIAGNOSIS — J4540 Moderate persistent asthma, uncomplicated: Secondary | ICD-10-CM | POA: Diagnosis not present

## 2021-01-01 DIAGNOSIS — J3089 Other allergic rhinitis: Secondary | ICD-10-CM | POA: Diagnosis not present

## 2021-01-09 DIAGNOSIS — J988 Other specified respiratory disorders: Secondary | ICD-10-CM | POA: Diagnosis not present

## 2021-01-09 DIAGNOSIS — J45909 Unspecified asthma, uncomplicated: Secondary | ICD-10-CM | POA: Diagnosis not present

## 2021-01-17 ENCOUNTER — Encounter: Payer: Self-pay | Admitting: Internal Medicine

## 2021-01-17 ENCOUNTER — Other Ambulatory Visit: Payer: Self-pay

## 2021-01-17 ENCOUNTER — Ambulatory Visit: Payer: BC Managed Care – PPO | Admitting: Internal Medicine

## 2021-01-17 DIAGNOSIS — J4520 Mild intermittent asthma, uncomplicated: Secondary | ICD-10-CM | POA: Diagnosis not present

## 2021-01-17 MED ORDER — PANTOPRAZOLE SODIUM 40 MG PO TBEC: 40.0000 mg | DELAYED_RELEASE_TABLET | Freq: Every day | ORAL | 2 refills | Status: DC

## 2021-01-17 MED ORDER — FAMOTIDINE 20 MG PO TABS: ORAL_TABLET | ORAL | 11 refills | Status: AC

## 2021-01-17 NOTE — Progress Notes (Signed)
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 Consult    Hx of Asthma-getting worse for 4 yrs., worse with exercise. Recurrent thrush.Cough-dry, occass. Wheezing.  Last well controlled x sev years ending in Feb 12 when moved into mother's house waiting for new house to be renovated  During the period of good control other than ex high dose dose advair, singulair allergy shots s need  Prior or flair still could not ex s throat constriction already on rx   Dyspnea:  Stopped working out due to sob  Cough: hoarseness/ mucus white  Sleep: better on side flat here  SABA use: 4 x daily   No obvious day to day or daytime variability or assoc excess/ purulent sputum or mucus plugs or hemoptysis or cp or chest tightness, subjective wheeze or overt sinus or hb symptoms.   Sleeping  without nocturnal  or early am exacerbation  of respiratory  c/o's or need for noct saba. Also denies any obvious fluctuation of symptoms with weather or environmental changes or other aggravating or alleviating factors except as outlined above   No unusual exposure hx or h/o childhood pna  or knowledge of premature birth.  Current Allergies, Complete Past Medical History, Past Surgical History, Family History, and Social History were reviewed in [employer] record.  ROS  The following are not active complaints unless bolded Hoarseness, sore throat, dysphagia, dental problems, itching, sneezing,   nasal congestion or discharge of excess mucus or purulent secretions, ear ache,   fever, chills, sweats, unintended wt loss or wt gain, classically pleuritic or exertional cp,  orthopnea pnd or arm/hand swelling  or leg swelling, presyncope, palpitations, abdominal pain, anorexia, nausea, vomiting, diarrhea  or change in bowel habits or change in bladder habits, change in stools or change in urine, dysuria, hematuria,  rash, arthralgias, visual complaints, headache, numbness, weakness or ataxia or problems with walking or coordination,  change in mood or  memory.           Past Medical History:  Diagnosis Date  . Asthma   . H/O: pneumothorax 03/06/2005   LEFT    Outpatient Medications Prior to Visit  Medication Sig Dispense Refill  . albuterol (PROVENTIL HFA;VENTOLIN HFA) 108 (90 BASE) MCG/ACT inhaler Inhale 2 puffs into the lungs every 6 (six) hours as needed for shortness of breath.    . budesonide-formoterol (SYMBICORT) 160-4.5 MCG/ACT inhaler Inhale 2 puffs into the lungs. 2 puffs twice a day    . cetirizine (ZYRTEC) 10 MG tablet Take 10 mg by mouth daily.    Marland Kitchen EPINEPHrine (EPIPEN) 0.3 mg/0.3 mL IJ SOAJ injection Inject 0.3 mLs (0.3 mg total) into the muscle as needed. 2 Device 1  . montelukast (SINGULAIR) 10 MG tablet Take 10 mg by mouth daily.    . nortriptyline (PAMELOR) 75 MG capsule TAKE 1 CAPSULE(75 MG) BY MOUTH DAILY AT BEDTIME    . omeprazole (PRILOSEC)  20 MG capsule Take 20 mg by mouth daily.    Marland Kitchen tiotropium (SPIRIVA) 18 MCG inhalation capsule Place 18 mcg into inhaler and inhale daily.    . diphenhydrAMINE (BENADRYL) 25 MG tablet Take 1 tablet (25 mg total) by mouth every 6 (six) hours. (Patient not taking: Reported on 01/17/2021) 20 tablet 0  . Fexofenadine HCl (ALLEGRA PO) Take 1 tablet by mouth daily as needed. (Patient not taking: No sig reported)    . fluconazole (DIFLUCAN) 200 MG tablet Take 200 mg by mouth daily. (Patient not taking: Reported on 01/17/2021)    . predniSONE  (DELTASONE) 10 MG tablet Take 5 tablets (50 mg total) by mouth daily. (Patient not taking: No sig reported) 25 tablet 0   No facility-administered medications prior to visit.     Objective:     BP 130/68 (BP Location: Left Arm, Cuff Size: Normal)   Pulse (!) 125   Temp (!) 97.1 F (36.2 C) (Temporal)   Ht 6' (1.829 m)   Wt 184 lb 9.6 oz (83.7 kg)   SpO2 97%   BMI 25.04 kg/m   SpO2: 97 %    HEENT : pt wearing mask not removed for exam due to covid -19 concerns.    NECK :  without JVD/Nodes/TM/ nl carotid upstrokes bilaterally   LUNGS: no acc muscle use,  Nl contour chest which is clear to A and P bilaterally without cough on insp or exp maneuvers   CV:  RRR  no s3 or murmur or increase in P2, and no edema   ABD:  soft and nontender with nl inspiratory excursion in the supine position. No bruits or organomegaly appreciated, bowel sounds nl  MS:  Nl gait/ ext warm without deformities, calf tenderness, cyanosis or clubbing No obvious joint restrictions   SKIN: warm and dry without lesions    NEURO:  alert, approp, nl sensorium with  no motor or cerebellar deficits apparent.       Assessment   Asthma Onset in childhood  -  Added gerd rx 01/17/2021   DDX of  difficult airways management almost all start with A and  include Adherence, Ace Inhibitors, Acid Reflux, Active Sinus Disease, Alpha 1 Antitripsin deficiency, Anxiety masquerading as Airways dz,  ABPA,  Allergy(esp in young), Aspiration (esp in elderly), Adverse effects of meds,  Active smoking or vaping, A bunch of PE's (a small clot burden can't cause this syndrome unless there is already severe underlying pulm or vascular dz with poor reserve) plus two Bs  = Bronchiectasis and Beta blocker use..and one C= CHF  Adherence is always the initial "prime suspect" and is a multilayered concern that requires a "trust but verify" approach in every patient - starting with knowing how to use medications, especially inhalers,  correctly, keeping up with refills and understanding the fundamental difference between maintenance and prns vs those medications only taken for a very short course and then stopped and not refilled.  - - The proper method of use, as well as anticipated side effects, of a metered-dose inhaler were discussed and demonstrated to the patient using teach back method.    ? Acid (or non-acid) GERD > always difficult to exclude as up to 75% of pts in some series report no assoc GI/ Heartburn symptoms> rec max (24h)  acid suppression and diet restrictions/ reviewed and instructions given in writing.   Allergy > singulair, allergy shots per Dr Barnetta Chapel, low threshold for biologics if continues to have a hard time  on a minimal dose of ICS   ? 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  ? ABPA > consider check IgE/Eos per Dr Barnetta Chapel off prednisone   I really don't see need for spiriva here but leave on for now   Re saba: I spent extra time with pt today reviewing appropriate use of albuterol for prn use on exertion with the following points: 1) saba is for relief of sob that does not improve by walking a slower pace or resting but rather if the pt does not improve after trying this first. 2) If the pt is convinced, as many are, that saba helps recover from activity faster then it's easy to tell if this is the case by re-challenging : ie stop, take the inhaler, then p 5 minutes try the exact same activity (intensity of workload) that just caused the symptoms and see if they are substantially diminished or not after saba 3) if there is an activity that reproducibly causes the symptoms, try the saba 15 min before the activity on alternate days   If in fact the saba really does help, then fine to continue to use it prn but advised may need to  look closer at the maintenance regimen being used to achieve better control of airways disease with exertion.   >>> f/u in 4 weeks with all meds in hand using a trust but verify approach to confirm accurate Medication  Reconciliation The principal here is that until we are certain that the  patients are doing what we've asked, it makes no sense to ask them to do more.          Each maintenance medication was reviewed in detail including emphasizing most importantly the difference between maintenance and prns and under what circumstances the prns are to be triggered using an action plan format where appropriate.  Total time for H and P, chart review, counseling, reviewing hfa device(s) and generating customized AVS unique to this initial office visit / same day charting > 45 min          Sandrea Hughs, MD 01/17/2021

## 2021-01-17 NOTE — Patient Instructions (Signed)
Plan A = Automatic = Always=    Symbicort 160 Take 2 puffs first thing in am and then another 2 puffs about 12 hours later.  Continue spiriva for now   Work on inhaler technique:  relax and gently blow all the way out then take a nice smooth deep breath back in, triggering the inhaler at same time you start breathing in.  Hold for up to 5 seconds if you can. Blow symbicort  out thru nose. Rinse and gargle with water when done     Plan B = Backup (to supplement plan A, not to replace it) Only use your albuterol inhaler as a rescue medication to be used if you can't catch your breath by resting or doing a relaxed purse lip breathing pattern.  - The less you use it, the better it will work when you need it. - Ok to use the inhaler up to 2 puffs  every 4 hours if you must but call for appointment if use goes up over your usual need - Don't leave home without it !!  (think of it like the spare tire for your car)    Pantoprazole (protonix) 40 mg   Take  30-60 min before first meal of the day and Pepcid (famotidine)  20 mg one after supper until return to office - this is the best way to tell whether stomach acid is contributing to your problem.    GERD (REFLUX)  is an extremely common cause of respiratory symptoms just like yours , many times with no obvious heartburn at all.    It can be treated with medication, but also with lifestyle changes including elevation of the head of your bed (ideally with 6 -8inch blocks under the headboard of your bed),  Smoking cessation, avoidance of late meals, excessive alcohol, and avoid fatty foods, chocolate, peppermint, colas, red wine, and acidic juices such as orange juice.  NO MINT OR MENTHOL PRODUCTS SO NO COUGH DROPS  USE SUGARLESS CANDY INSTEAD (Jolley ranchers or Stover's or Life Savers) or even ice chips will also do - the key is to swallow to prevent all throat clearing. NO OIL BASED VITAMINS - use powdered substitutes.  Avoid fish oil when  coughing.   Please schedule a follow up office visit in 4 weeks, sooner if needed  with all medications /inhalers/ solutions in hand so we can verify exactly what you are taking. This includes all medications from all doctors and over the counters

## 2021-01-18 ENCOUNTER — Encounter: Payer: Self-pay | Admitting: Internal Medicine

## 2021-01-18 NOTE — Assessment & Plan Note (Addendum)
Onset in childhood  -  Added gerd rx 01/17/2021   DDX of  difficult airways management almost all start with A and  include Adherence, Ace Inhibitors, Acid Reflux, Active Sinus Disease, Alpha 1 Antitripsin deficiency, Anxiety masquerading as Airways dz,  ABPA,  Allergy(esp in young), Aspiration (esp in elderly), Adverse effects of meds,  Active smoking or vaping, A bunch of PE's (a small clot burden can't cause this syndrome unless there is already severe underlying pulm or vascular dz with poor reserve) plus two Bs  = Bronchiectasis and Beta blocker use..and one C= CHF  Adherence is always the initial "prime suspect" and is a multilayered concern that requires a "trust but verify" approach in every patient - starting with knowing how to use medications, especially inhalers, correctly, keeping up with refills and understanding the fundamental difference between maintenance and prns vs those medications only taken for a very short course and then stopped and not refilled.  - - The proper method of use, as well as anticipated side effects, of a metered-dose inhaler were discussed and demonstrated to the patient using teach back method.    ? Acid (or non-acid) GERD > always difficult to exclude as up to 75% of pts in some series report no assoc GI/ Heartburn symptoms> rec max (24h)  acid suppression and diet restrictions/ reviewed and instructions given in writing.   Allergy > singulair, allergy shots per Dr Barnetta Chapel, low threshold for biologics if continues to have a hard time on a minimal dose of ICS   ? 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  ? ABPA > consider check IgE/Eos per Dr Barnetta Chapel off prednisone   I really don't see need for spiriva here but leave on for now   Re saba: I spent extra time with pt today reviewing  appropriate use of albuterol for prn use on exertion with the following points: 1) saba is for relief of sob that does not improve by walking a slower pace or resting but rather if the pt does not improve after trying this first. 2) If the pt is convinced, as many are, that saba helps recover from activity faster then it's easy to tell if this is the case by re-challenging : ie stop, take the inhaler, then p 5 minutes try the exact same activity (intensity of workload) that just caused the symptoms and see if they are substantially diminished or not after saba 3) if there is an activity that reproducibly causes the symptoms, try the saba 15 min before the activity on alternate days   If in fact the saba really does help, then fine to continue to use it prn but advised may need to look closer at the maintenance regimen being used to achieve better control of airways disease with exertion.   >>> f/u in 4 weeks with all meds in hand using a trust but verify approach to confirm accurate Medication  Reconciliation The principal here is that until we are certain that the  patients are doing what we've asked, it makes no sense to ask them to do more.          Each maintenance medication was reviewed in detail including emphasizing most importantly the difference between maintenance and prns and under what circumstances the prns  are to be triggered using an action plan format where appropriate.  Total time for H and P, chart review, counseling, reviewing hfa device(s) and generating customized AVS unique to this initial office visit / same day charting > 45 min

## 2021-01-25 ENCOUNTER — Ambulatory Visit: Payer: BC Managed Care – PPO | Admitting: Internal Medicine

## 2021-02-28 ENCOUNTER — Ambulatory Visit: Payer: BC Managed Care – PPO | Admitting: Internal Medicine

## 2021-02-28 NOTE — Progress Notes (Deleted)
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 Consult    Hx of Asthma-getting worse for 4 yrs., worse with exercise. Recurrent thrush.Cough-dry, occass. Wheezing.  Last well controlled x sev years ending in Feb 12 when moved into mother's house waiting for new house to be renovated  During the period of good control other than ex high dose dose advair, singulair allergy shots s need  Prior or flair still could not ex s throat constriction already on rx  Dyspnea:  Stopped working out due to sob  Cough: hoarseness/ mucus white  Sleep: better on side flat here  SABA use: 4 x daily  rec Plan A = Automatic = Always=    Symbicort 160 Take 2 puffs first thing in am and then another 2 puffs about 12 hours later.  Continue spiriva for now  Work on inhaler technique:  Plan B = Backup (to supplement plan A, not to replace it) Only use your albuterol inhaler as a rescue medication Pantoprazole (protonix) 40 mg   Take  30-60 min before first meal of the day and Pepcid (famotidine)  20 mg one after supper until return to office - this is the best way to tell whether stomach acid is contributing to your problem.   GERD diet Please schedule a follow up office visit in 4 weeks, sooner if needed  with all medications /inhalers/ solutions in hand so we can verify exactly what you are taking. This includes all medications from all doctors and over the counters  02/28/2021  f/u ov/Jahnya Trindade re:  No chief  complaint on file.   Dyspnea:  *** Cough: *** Sleeping: *** SABA use: *** 02: *** Covid status:   ***   No obvious day to day or daytime variability or assoc excess/ purulent sputum or mucus plugs or hemoptysis or cp or chest tightness, subjective wheeze or overt sinus or hb symptoms.   *** without nocturnal  or early am exacerbation  of respiratory  c/o's or need for noct saba. Also denies any obvious fluctuation of symptoms with weather or environmental changes or other aggravating or alleviating factors except as outlined above   No unusual exposure hx or h/o childhood pna/ asthma or knowledge of premature birth.  Current Allergies, Complete Past Medical History, Past Surgical History, Family History, and Social History were reviewed in [employer] record.  ROS  The following are not active complaints unless bolded Hoarseness, sore throat, dysphagia, dental problems, itching, sneezing,  nasal congestion or discharge of excess mucus or purulent secretions, ear ache,   fever, chills, sweats, unintended wt loss or wt gain, classically pleuritic or exertional cp,  orthopnea pnd or arm/hand swelling  or leg swelling, presyncope, palpitations, abdominal pain, anorexia, nausea, vomiting, diarrhea  or change in bowel habits or change in bladder habits, change in stools or change in urine, dysuria, hematuria,  rash, arthralgias, visual complaints, headache, numbness, weakness or  ataxia or problems with walking or coordination,  change in mood or  memory.        No outpatient medications have been marked as taking for the 02/28/21 encounter (Appointment) with [name], MD.             Past Medical History:  Diagnosis Date  . Asthma   . H/O: pneumothorax 03/06/2005   LEFT           Objective:       Wt Readings from Last 3 Encounters:  01/17/21 184 lb 9.6 oz (83.7 kg)  12/13/20 185 lb (83.9 kg)  04/16/13 165 lb (74.8 kg)      Vital signs reviewed   02/28/2021  - Note at rest 02 sats  ***% on ***   General appearance:    ***        Assessment        Sandrea Hughs, MD 01/17/2021

## 2021-03-16 ENCOUNTER — Ambulatory Visit: Payer: BC Managed Care – PPO | Admitting: Internal Medicine

## 2021-03-16 ENCOUNTER — Other Ambulatory Visit: Payer: Self-pay

## 2021-03-16 ENCOUNTER — Encounter: Payer: Self-pay | Admitting: Internal Medicine

## 2021-03-16 ENCOUNTER — Ambulatory Visit (INDEPENDENT_AMBULATORY_CARE_PROVIDER_SITE_OTHER): Payer: BC Managed Care – PPO | Admitting: Internal Medicine

## 2021-03-16 DIAGNOSIS — J4520 Mild intermittent asthma, uncomplicated: Secondary | ICD-10-CM | POA: Diagnosis not present

## 2021-03-16 NOTE — Progress Notes (Signed)
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 Consult    Hx of Asthma-getting worse for 4 yrs., worse with exercise. Recurrent thrush.Cough-dry, occass. Wheezing.  Last well controlled x sev years ending in Feb 12 when moved into mother's house waiting for new house to be renovated  During the period of good control other than ex high dose dose advair, singulair allergy shots s need  Prior or flair still could not ex s throat constriction already on rx  Dyspnea:  Stopped working out due to sob  Cough: hoarseness/ mucus white  Sleep: better on side flat here  SABA use: 4 x daily  rec Plan A = Automatic = Always=    Symbicort 160 Take 2 puffs first thing in am and then another 2 puffs about 12 hours later.  Continue spiriva for now  Work on inhaler technique:  Plan B = Backup (to supplement plan A, not to replace it) Only use your albuterol inhaler as a rescue medication Pantoprazole (protonix) 40 mg   Take  30-60 min before first meal of the day and Pepcid (famotidine)  20 mg one after supper until return to office - this is the best way to tell whether stomach acid is contributing to your problem.   GERD diet Please schedule a follow up office visit in 4 weeks, sooner if needed  with all medications /inhalers/ solutions in hand so we can verify exactly what you are taking. This includes all medications from all doctors and over the counters  Virtual Visit via Telephone Note 03/16/2021    I connected with Alexander Allison on 03/16/21 at  9:30 AM EDT by telephone and verified that I am speaking with the correct person using two identifiers. Pt is at home and this call made from my office with no other participants    I discussed the limitations, risks, security and privacy concerns of performing an evaluation and management service by telephone and the availability of in person appointments. I also discussed with the patient that there may be a patient responsible charge related to this service. The patient expressed understanding and agreed to proceed.   History of Present Illness: asthma  Dyspnea:  Running again jogging x 30 min around neighborhood / some hills  Cough: none  Sleeping: none  SABA use: none  02: none Minimal nasal congetion No throat symptoms   No obvious day to day or daytime variability or assoc excess/ purulent sputum or mucus plugs or hemoptysis or cp or chest tightness, subjective wheeze or overt sinus or hb symptoms.    Also denies any obvious fluctuation of symptoms with weather or environmental changes or other aggravating or alleviating factors except as outlined above.   Meds reviewed/ med reconciliation completed         Observations/Objective: Good voice texture, no conversational sob/ no throat clearing or spont cough    Assessment and Plan: See problem  list for active a/p's   Follow Up Instructions: See avs for instructions unique to this ov which includes revised/ updated med list     I discussed the assessment and treatment plan with the patient. The patient was provided an opportunity to ask questions and all were answered. The patient agreed with the plan and demonstrated an understanding of the instructions.   The patient was advised to call back or seek an in-person evaluation if the symptoms worsen or if the condition fails to improve as anticipated.  I provided 15  minutes of non-face-to-face time during this  encounter.   Sandrea Hughs, MD

## 2021-03-16 NOTE — Progress Notes (Deleted)
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 Consult    Hx of Asthma-getting worse for 4 yrs., worse with exercise. Recurrent thrush.Cough-dry, occass. Wheezing.  Last well controlled x sev years ending in Feb 12 when moved into mother's house waiting for new house to be renovated  During the period of good control other than ex high dose dose advair, singulair allergy shots s need  Prior or flair still could not ex s throat constriction already on rx  Dyspnea:  Stopped working out due to sob  Cough: hoarseness/ mucus white  Sleep: better on side flat here  SABA use: 4 x daily  rec Plan A = Automatic = Always=    Symbicort 160 Take 2 puffs first thing in am and then another 2 puffs about 12 hours later.  Continue spiriva for now  Work on inhaler technique:  Plan B = Backup (to supplement plan A, not to replace it) Only use your albuterol inhaler as a rescue medication Pantoprazole (protonix) 40 mg   Take  30-60 min before first meal of the day and Pepcid (famotidine)  20 mg one after supper until return to office - this is the best way to tell whether stomach acid is contributing to your problem.   GERD diet Please schedule a follow up office visit in 4 weeks, sooner if needed  with all medications /inhalers/ solutions in hand so we can verify exactly what you are taking. This includes all medications from all doctors and over the counters  03/16/2021  f/u ov/Alexander Allison re:  No chief  complaint on file.   Dyspnea:  *** Cough: *** Sleeping: *** SABA use: *** 02: *** Covid status:   ***   No obvious day to day or daytime variability or assoc excess/ purulent sputum or mucus plugs or hemoptysis or cp or chest tightness, subjective wheeze or overt sinus or hb symptoms.   *** without nocturnal  or early am exacerbation  of respiratory  c/o's or need for noct saba. Also denies any obvious fluctuation of symptoms with weather or environmental changes or other aggravating or alleviating factors except as outlined above   No unusual exposure hx or h/o childhood pna/ asthma or knowledge of premature birth.  Current Allergies, Complete Past Medical History, Past Surgical History, Family History, and Social History were reviewed in [employer] record.  ROS  The following are not active complaints unless bolded Hoarseness, sore throat, dysphagia, dental problems, itching, sneezing,  nasal congestion or discharge of excess mucus or purulent secretions, ear ache,   fever, chills, sweats, unintended wt loss or wt gain, classically pleuritic or exertional cp,  orthopnea pnd or arm/hand swelling  or leg swelling, presyncope, palpitations, abdominal pain, anorexia, nausea, vomiting, diarrhea  or change in bowel habits or change in bladder habits, change in stools or change in urine, dysuria, hematuria,  rash, arthralgias, visual complaints, headache, numbness, weakness or  ataxia or problems with walking or coordination,  change in mood or  memory.        No outpatient medications have been marked as taking for the 03/16/21 encounter (Appointment) with Alexander Cowden, MD.             Past Medical History:  Diagnosis Date  . Asthma   . H/O: pneumothorax 03/06/2005   LEFT           Objective:       Wt Readings from Last 3 Encounters:  01/17/21 184 lb 9.6 oz (83.7 kg)  12/13/20 185 lb (83.9 kg)  04/16/13 165 lb (74.8 kg)      Vital signs reviewed   03/16/2021  - Note at rest 02 sats  ***% on ***   General appearance:    ***        Assessment        Alexander Hughs, MD 01/17/2021

## 2021-03-16 NOTE — Assessment & Plan Note (Signed)
Onset in childhood  -  Added gerd rx 01/17/2021 to symbicort 160 2bid   All goals of chronic asthma control met including optimal function and elimination of symptoms with minimal need for rescue therapy.  Contingencies discussed in full including contacting this office immediately if not controlling the symptoms using the rule of two's.     Ok to taper symbicort Based on two studies from [facility]  378; 75 p 1865 (2018) and 380 : p2020-30 (2019) in pts with mild asthma it is reasonable to use  symbicort  "prn" flare in this setting but I emphasized this was only shown with symbicort and takes advantage of the rapid onset of action but is not the same as "rescue therapy" but can be stopped once the acute symptoms have resolved and the need for rescue has been minimized (< 2 x weekly)    Will continue gerd rx for now and consider taper to PEPCID on return in 6 weeks  Each maintenance medication was reviewed in detail including most importantly the difference between maintenance and as needed and under what circumstances the prns are to be used.  Please see AVS for specific  Instructions which are unique to this visit and I personally typed out  which were reviewed in detail over the phone with the patient and a copy provided via mail

## 2021-03-16 NOTE — Patient Instructions (Signed)
Taper the symbicort 160 to one puff twice daily for at least a week then one daily until return to office  At the first sign of any flares change symbicort immediately back to Take 2 puffs first thing in am and then another 2 puffs about 12 hours later.   Please schedule a follow up office visit in 6 weeks, call sooner if needed with all medications /inhalers/ solutions in hand so we can verify exactly what you are taking. This includes all medications from all doctors and over the counters

## 2021-04-04 DIAGNOSIS — Z0000 Encounter for general adult medical examination without abnormal findings: Secondary | ICD-10-CM | POA: Diagnosis not present

## 2021-04-11 DIAGNOSIS — Z13220 Encounter for screening for lipoid disorders: Secondary | ICD-10-CM | POA: Diagnosis not present

## 2021-04-11 DIAGNOSIS — Z131 Encounter for screening for diabetes mellitus: Secondary | ICD-10-CM | POA: Diagnosis not present

## 2021-04-21 ENCOUNTER — Other Ambulatory Visit: Payer: Self-pay | Admitting: Internal Medicine

## 2021-04-27 ENCOUNTER — Ambulatory Visit: Payer: BC Managed Care – PPO | Admitting: Internal Medicine

## 2021-05-15 ENCOUNTER — Encounter: Payer: Self-pay | Admitting: Internal Medicine

## 2021-05-15 ENCOUNTER — Ambulatory Visit: Payer: BC Managed Care – PPO | Admitting: Internal Medicine

## 2021-05-15 ENCOUNTER — Other Ambulatory Visit: Payer: Self-pay

## 2021-05-15 DIAGNOSIS — J4520 Mild intermittent asthma, uncomplicated: Secondary | ICD-10-CM | POA: Diagnosis not present

## 2021-05-15 MED ORDER — PANTOPRAZOLE SODIUM 40 MG PO TBEC: 40.0000 mg | DELAYED_RELEASE_TABLET | Freq: Every day | ORAL | 2 refills | Status: DC

## 2021-05-15 NOTE — Progress Notes (Signed)
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 Recurrent thrush.Cough-dry, occass. Wheezing.  Last well controlled x sev years ending in Feb 12 when moved into mother's house waiting for new house to be renovated  During the period of good control other than ex high dose dose advair, singulair allergy shots s need  Prior or flair still could not ex s throat constriction already on rx  Dyspnea:  Stopped working out due to sob  Cough: hoarseness/ mucus white  Sleep: better on side flat here  SABA use: 4 x daily  Rec Plan A = Automatic = Always=    Symbicort 160 Take 2 puffs first thing in am and then another 2 puffs about 12 hours later.  Continue spiriva for now  Work on inhaler technique:  relax and gently blow all the way out then take a nice smooth deep breath back in, triggering the inhaler at same time you start breathing in.  Hold for up to 5 seconds if you can. Blow symbicort  out thru nose. Rinse and gargle with water when done Plan B = Backup (to supplement plan A, not to replace it) Only use your albuterol inhaler as a rescue medication  Pantoprazole (protonix) 40 mg   Take  30-60 min before first meal of the day and Pepcid (famotidine)  20 mg one after supper until return to office - this is the best way to tell whether stomach acid is contributing to your problem.   GERD diet/ bed blocks  Covid  infection mid may  Omicron 5 days prior to scheudled visit   Televist 03/16/21  doing great despite covid since starting ger rx    Taper the symbicort 160 to one puff twice daily for at least a week then one daily until return to office At the first sign of any flares change symbicort immediately back to Take 2 puffs first thing in am and then another 2 puffs about 12 hours later.    05/15/2021  f/u ov/[name] re: asthma / gerd ok on max rx plus symb 160 2 qhs  Chief Complaint  Patient presents with   Cough    Reports improved since last pulmonary visit and better managing GERD   Dyspnea:  Not limited by breathing from desired activities   Cough: none Sleeping: no resp symptoms  SABA use: none on symb 160 2 q pm  02: none Covid status:   once    No obvious day to day or daytime variability or assoc excess/ purulent sputum or mucus plugs or hemoptysis or cp or chest tightness, subjective wheeze or overt sinus or hb symptoms.   Sleeping  without nocturnal  or early am exacerbation  of respiratory  c/o's or need for noct saba. Also denies any obvious fluctuation of symptoms with weather or environmental changes or  other aggravating or alleviating factors except as outlined above   No unusual exposure hx or h/o childhood pna/ asthma or knowledge of premature birth.  Current Allergies, Complete Past Medical History, Past Surgical History, Family History, and Social History were reviewed in [employer] record.  ROS  The following are not active complaints unless bolded Hoarseness, sore throat, dysphagia, dental problems, itching, sneezing,  nasal congestion or discharge of excess mucus or purulent secretions, ear ache,   fever, chills, sweats, unintended wt loss or wt gain, classically pleuritic or exertional cp,  orthopnea pnd or arm/hand swelling  or leg swelling, presyncope, palpitations, abdominal pain, anorexia, nausea, vomiting, diarrhea  or change in bowel habits or  change in bladder habits, change in stools or change in urine, dysuria, hematuria,  rash, arthralgias, visual complaints, headache, numbness, weakness or ataxia or problems with walking or coordination,  change in mood or  memory.        No outpatient medications have been marked as taking for the 05/15/21 encounter (Office Visit) with [name], MD.            Objective:      Wt Readings from Last 3 Encounters:  05/15/21 190 lb (86.2 kg)  01/17/21 184 lb 9.6 oz (83.7 kg)  12/13/20 185 lb (83.9 kg)      Vital signs reviewed  05/15/2021  - Note at rest 02 sats  97% on RA    General appearance:    pleasant amb wm nad   HEENT : pt wearing mask not removed for exam due to covid -19 concerns.    NECK :  without JVD/Nodes/TM/ nl carotid upstrokes bilaterally   LUNGS: no acc muscle use,  Nl contour chest which is clear to A and P bilaterally without cough on insp or exp maneuvers   CV:  RRR  no s3 or murmur or increase in P2, and no edema   ABD:  soft and nontender with nl inspiratory excursion in the supine position. No bruits or organomegaly appreciated, bowel sounds nl  MS:  Nl gait/ ext warm without deformities, calf tenderness, cyanosis or clubbing No obvious joint restrictions   SKIN: warm and dry without lesions    NEURO:  alert, approp, nl sensorium with  no motor or cerebellar deficits apparent.         Assessment

## 2021-05-15 NOTE — Assessment & Plan Note (Addendum)
Onset in childhood  -  Added gerd rx 01/17/2021 to symbicort 160 2bid   Now that on gerd rx : All goals of chronic asthma control met including optimal function and elimination of symptoms with minimal need for rescue therapy.  Contingencies discussed in full including contacting this office immediately if not controlling the symptoms using the rule of two's.     Rec: Change symbicort 160:   Based on two studies from [facility]  790; 20 p 1865 (2018) and 380 : p2020-30 (2019) in pts with mild asthma it is reasonable to use  symbicort 160 2bid "prn" flare in this setting but I emphasized this was only shown with symbicort and takes advantage of the rapid onset of action but is not the same as "rescue therapy" but can be stopped once the acute symptoms have resolved and the need for rescue has been minimized (< 2 x weekly)    If consistently off symbicort 160 can then taper gerd rx to just pepcid, then taper than that and see if/when flare occurs and if right back with the same symptoms then consider refer to GI at age 51 to look at other long term options for GERD eg NF   F/u here in 3 m, sooner if needed          Each maintenance medication was reviewed in detail including emphasizing most importantly the difference between maintenance and prns and under what circumstances the prns are to be triggered using an action plan format where appropriate.  Total time for H and P, chart review, counseling, reviewing  hva device(s) and generating customized AVS unique to this office visit / same day charting = 22 min

## 2021-05-15 NOTE — Patient Instructions (Addendum)
Taper symbicort 160 to  where you take it  up to 2 puffs every 12 hours down to zero if doing great   Immediately go to 2 pfffs every 12 hours for any flare x at least one week  Once off  symbicort for at least a few weeks then try stop pantoprazole and replace it with pepcid 20 mg twice daily after bfast and supper for at least weak and then down to just once after supper.    Please schedule a follow up visit in 3 months but call sooner if needed

## 2021-06-07 DIAGNOSIS — L309 Dermatitis, unspecified: Secondary | ICD-10-CM | POA: Diagnosis not present

## 2021-06-07 DIAGNOSIS — F411 Generalized anxiety disorder: Secondary | ICD-10-CM | POA: Diagnosis not present

## 2021-06-07 DIAGNOSIS — G4700 Insomnia, unspecified: Secondary | ICD-10-CM | POA: Diagnosis not present

## 2021-07-05 DIAGNOSIS — Z23 Encounter for immunization: Secondary | ICD-10-CM | POA: Diagnosis not present

## 2021-07-05 DIAGNOSIS — M79672 Pain in left foot: Secondary | ICD-10-CM | POA: Diagnosis not present

## 2021-07-05 DIAGNOSIS — M79645 Pain in left finger(s): Secondary | ICD-10-CM | POA: Diagnosis not present

## 2021-09-07 DIAGNOSIS — M25531 Pain in right wrist: Secondary | ICD-10-CM | POA: Diagnosis not present

## 2021-09-07 DIAGNOSIS — M25532 Pain in left wrist: Secondary | ICD-10-CM | POA: Diagnosis not present

## 2021-09-28 DIAGNOSIS — Z03818 Encounter for observation for suspected exposure to other biological agents ruled out: Secondary | ICD-10-CM | POA: Diagnosis not present

## 2021-09-28 DIAGNOSIS — B349 Viral infection, unspecified: Secondary | ICD-10-CM | POA: Diagnosis not present

## 2021-10-10 DIAGNOSIS — M25531 Pain in right wrist: Secondary | ICD-10-CM | POA: Diagnosis not present

## 2021-10-10 DIAGNOSIS — M25532 Pain in left wrist: Secondary | ICD-10-CM | POA: Diagnosis not present

## 2021-10-18 DIAGNOSIS — Z03818 Encounter for observation for suspected exposure to other biological agents ruled out: Secondary | ICD-10-CM | POA: Diagnosis not present

## 2021-10-18 DIAGNOSIS — B349 Viral infection, unspecified: Secondary | ICD-10-CM | POA: Diagnosis not present

## 2021-10-18 DIAGNOSIS — J101 Influenza due to other identified influenza virus with other respiratory manifestations: Secondary | ICD-10-CM | POA: Diagnosis not present

## 2021-10-18 DIAGNOSIS — J029 Acute pharyngitis, unspecified: Secondary | ICD-10-CM | POA: Diagnosis not present

## 2021-10-18 DIAGNOSIS — R509 Fever, unspecified: Secondary | ICD-10-CM | POA: Diagnosis not present

## 2021-10-18 DIAGNOSIS — J069 Acute upper respiratory infection, unspecified: Secondary | ICD-10-CM | POA: Diagnosis not present

## 2021-11-29 DIAGNOSIS — R059 Cough, unspecified: Secondary | ICD-10-CM | POA: Diagnosis not present

## 2021-11-29 DIAGNOSIS — J0101 Acute recurrent maxillary sinusitis: Secondary | ICD-10-CM | POA: Diagnosis not present

## 2021-11-29 DIAGNOSIS — Z03818 Encounter for observation for suspected exposure to other biological agents ruled out: Secondary | ICD-10-CM | POA: Diagnosis not present

## 2021-12-28 DIAGNOSIS — F4320 Adjustment disorder, unspecified: Secondary | ICD-10-CM | POA: Diagnosis not present

## 2022-01-01 DIAGNOSIS — F4320 Adjustment disorder, unspecified: Secondary | ICD-10-CM | POA: Diagnosis not present

## 2022-01-11 DIAGNOSIS — F4320 Adjustment disorder, unspecified: Secondary | ICD-10-CM | POA: Diagnosis not present

## 2022-01-25 DIAGNOSIS — F4320 Adjustment disorder, unspecified: Secondary | ICD-10-CM | POA: Diagnosis not present

## 2022-02-01 DIAGNOSIS — F4320 Adjustment disorder, unspecified: Secondary | ICD-10-CM | POA: Diagnosis not present

## 2022-02-05 ENCOUNTER — Other Ambulatory Visit: Payer: Self-pay | Admitting: Internal Medicine

## 2022-02-15 DIAGNOSIS — F4320 Adjustment disorder, unspecified: Secondary | ICD-10-CM | POA: Diagnosis not present

## 2022-03-01 DIAGNOSIS — F4320 Adjustment disorder, unspecified: Secondary | ICD-10-CM | POA: Diagnosis not present

## 2022-04-05 DIAGNOSIS — F4320 Adjustment disorder, unspecified: Secondary | ICD-10-CM | POA: Diagnosis not present

## 2022-04-11 DIAGNOSIS — D225 Melanocytic nevi of trunk: Secondary | ICD-10-CM | POA: Diagnosis not present

## 2022-04-11 DIAGNOSIS — L814 Other melanin hyperpigmentation: Secondary | ICD-10-CM | POA: Diagnosis not present

## 2022-04-11 DIAGNOSIS — D485 Neoplasm of uncertain behavior of skin: Secondary | ICD-10-CM | POA: Diagnosis not present

## 2022-04-26 DIAGNOSIS — F4320 Adjustment disorder, unspecified: Secondary | ICD-10-CM | POA: Diagnosis not present

## 2022-04-29 DIAGNOSIS — S61432A Puncture wound without foreign body of left hand, initial encounter: Secondary | ICD-10-CM | POA: Diagnosis not present

## 2022-04-30 DIAGNOSIS — E781 Pure hyperglyceridemia: Secondary | ICD-10-CM | POA: Diagnosis not present

## 2022-04-30 DIAGNOSIS — K588 Other irritable bowel syndrome: Secondary | ICD-10-CM | POA: Diagnosis not present

## 2022-05-10 DIAGNOSIS — F4320 Adjustment disorder, unspecified: Secondary | ICD-10-CM | POA: Diagnosis not present

## 2022-06-07 DIAGNOSIS — F4320 Adjustment disorder, unspecified: Secondary | ICD-10-CM | POA: Diagnosis not present

## 2022-06-21 DIAGNOSIS — F4320 Adjustment disorder, unspecified: Secondary | ICD-10-CM | POA: Diagnosis not present

## 2022-07-26 DIAGNOSIS — Z23 Encounter for immunization: Secondary | ICD-10-CM | POA: Diagnosis not present

## 2022-09-16 DIAGNOSIS — J069 Acute upper respiratory infection, unspecified: Secondary | ICD-10-CM | POA: Diagnosis not present

## 2022-09-16 DIAGNOSIS — B356 Tinea cruris: Secondary | ICD-10-CM | POA: Diagnosis not present

## 2022-11-26 DIAGNOSIS — J029 Acute pharyngitis, unspecified: Secondary | ICD-10-CM | POA: Diagnosis not present

## 2022-11-26 DIAGNOSIS — R112 Nausea with vomiting, unspecified: Secondary | ICD-10-CM | POA: Diagnosis not present

## 2022-11-26 DIAGNOSIS — B49 Unspecified mycosis: Secondary | ICD-10-CM | POA: Diagnosis not present

## 2022-11-26 DIAGNOSIS — Z03818 Encounter for observation for suspected exposure to other biological agents ruled out: Secondary | ICD-10-CM | POA: Diagnosis not present

## 2023-02-18 DIAGNOSIS — R198 Other specified symptoms and signs involving the digestive system and abdomen: Secondary | ICD-10-CM | POA: Diagnosis not present

## 2023-02-18 DIAGNOSIS — K602 Anal fissure, unspecified: Secondary | ICD-10-CM | POA: Diagnosis not present

## 2023-02-18 DIAGNOSIS — K5909 Other constipation: Secondary | ICD-10-CM | POA: Diagnosis not present

## 2023-02-18 DIAGNOSIS — K5900 Constipation, unspecified: Secondary | ICD-10-CM | POA: Diagnosis not present

## 2023-02-18 DIAGNOSIS — K6289 Other specified diseases of anus and rectum: Secondary | ICD-10-CM | POA: Diagnosis not present

## 2023-03-25 DIAGNOSIS — F4320 Adjustment disorder, unspecified: Secondary | ICD-10-CM | POA: Diagnosis not present

## 2023-04-08 DIAGNOSIS — F4320 Adjustment disorder, unspecified: Secondary | ICD-10-CM | POA: Diagnosis not present

## 2023-04-15 DIAGNOSIS — D485 Neoplasm of uncertain behavior of skin: Secondary | ICD-10-CM | POA: Diagnosis not present

## 2023-04-15 DIAGNOSIS — L814 Other melanin hyperpigmentation: Secondary | ICD-10-CM | POA: Diagnosis not present

## 2023-04-15 DIAGNOSIS — D225 Melanocytic nevi of trunk: Secondary | ICD-10-CM | POA: Diagnosis not present

## 2023-04-15 DIAGNOSIS — D229 Melanocytic nevi, unspecified: Secondary | ICD-10-CM | POA: Diagnosis not present

## 2023-04-15 DIAGNOSIS — L218 Other seborrheic dermatitis: Secondary | ICD-10-CM | POA: Diagnosis not present

## 2023-04-21 DIAGNOSIS — U071 COVID-19: Secondary | ICD-10-CM | POA: Diagnosis not present

## 2023-06-06 DIAGNOSIS — H8110 Benign paroxysmal vertigo, unspecified ear: Secondary | ICD-10-CM | POA: Diagnosis not present

## 2023-07-22 DIAGNOSIS — Z8719 Personal history of other diseases of the digestive system: Secondary | ICD-10-CM | POA: Diagnosis not present

## 2023-07-22 DIAGNOSIS — R195 Other fecal abnormalities: Secondary | ICD-10-CM | POA: Diagnosis not present

## 2023-09-01 DIAGNOSIS — J988 Other specified respiratory disorders: Secondary | ICD-10-CM | POA: Diagnosis not present

## 2023-10-07 DIAGNOSIS — Z23 Encounter for immunization: Secondary | ICD-10-CM | POA: Diagnosis not present

## 2023-10-13 DIAGNOSIS — J4520 Mild intermittent asthma, uncomplicated: Secondary | ICD-10-CM | POA: Diagnosis not present

## 2023-12-29 DIAGNOSIS — J019 Acute sinusitis, unspecified: Secondary | ICD-10-CM | POA: Diagnosis not present

## 2023-12-29 DIAGNOSIS — B349 Viral infection, unspecified: Secondary | ICD-10-CM | POA: Diagnosis not present

## 2024-01-04 NOTE — Progress Notes (Unsigned)
 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 Recurrent thrush.Cough-dry, occass. Wheezing.  Last well controlled x sev years ending in Feb 12 when moved into mother's house waiting for new house to be renovated  During the period of good control other than ex high dose dose advair, singulair allergy shots s need  Prior or flair still could not ex s throat constriction already on rx  Dyspnea:  Stopped working out due to sob  Cough: hoarseness/ mucus white  Sleep: better on side flat here  SABA use: 4 x daily  Rec Plan A = Automatic = Always=    Symbicort 160 Take 2 puffs first thing in am and then another 2 puffs about 12 hours later.  Continue spiriva for now  Work on inhaler technique:  relax and gently blow all the way out then take a nice smooth deep breath back in, triggering the inhaler at same time you start breathing in.  Hold for up to 5 seconds if you can. Blow symbicort  out thru nose. Rinse and gargle with water when done Plan B = Backup (to supplement plan A, not to replace it) Only use your albuterol inhaler as a rescue medication  Pantoprazole (protonix) 40 mg   Take  30-60 min before first meal of the day and Pepcid (famotidine)  20 mg one after supper until return to office - this is the best way to tell whether stomach acid is contributing to your problem.   GERD diet/ bed blocks  Covid  infection mid may  Omicron 5 days prior to scheudled visit   Televist 03/16/21  doing great despite covid since starting ger rx    Taper the symbicort 160 to one puff twice daily for at least a week then one daily until return to office At the first sign of any flares change symbicort immediately back to Take 2 puffs first thing in am and then another 2 puffs about 12 hours later.    05/15/2021  f/u ov/Malillany Kazlauskas re: asthma / gerd ok on max rx plus symb 160 2 qhs  Chief Complaint  Patient presents with   Cough    Reports improved since last pulmonary visit and better managing GERD   Dyspnea:  Not limited by breathing from desired activities   Cough: none Sleeping: no resp symptoms  SABA use: none on symb 160 2 q pm  02: none Rec Taper symbicort 160 to  where you take it  up to 2 puffs every 12 hours down to zero if doing great  Immediately go to 2 pfffs every 12 hours for any flare x at least one week Once off  symbicort for at least a few weeks then try stop pantoprazole and replace it with pepcid 20 mg twice daily after bfast and supper for at least weak and  then down to just once after supper.   Please schedule a follow up visit in 3 months but call sooner if needed    01/06/2024  f/u ov/Davyon Fisch re: asthma flared with uri fall 2024  maint on pepcid 20 mg bid and symbicort 80 prn   Chief Complaint  Patient presents with   Follow-up  Dyspnea:  not limited from ex  Cough: each pm p supper before lie down /tends to settle down once asleep  Sleeping: fine p symbicort 80 x one s resp cc  SABA use: none  02: none     No obvious day to day or daytime variability or assoc excess/ purulent sputum or mucus plugs or hemoptysis or cp or chest tightness, subjective wheeze or overt sinus or hb symptoms.    Also denies any obvious fluctuation of symptoms with weather or environmental changes or other aggravating or alleviating factors except as outlined above   No unusual exposure hx or h/o childhood  pna/  knowledge of premature birth.  Current Allergies, Complete Past Medical History, Past Surgical History, Family History, and Social History were reviewed in [employer] record.  ROS  The following are not active complaints unless bolded Hoarseness, sore throat, dysphagia, dental problems, itching, sneezing,  nasal congestion or discharge of excess mucus or purulent secretions, ear ache,   fever, chills, sweats, unintended wt loss or wt gain, classically pleuritic or exertional cp,  orthopnea pnd or arm/hand swelling  or leg swelling, presyncope, palpitations, abdominal pain, anorexia, nausea, vomiting, diarrhea  or change in bowel habits or change in bladder habits, change in stools or change in urine, dysuria, hematuria,  rash, arthralgias, visual complaints, headache, numbness, weakness or ataxia or problems with walking or coordination,  change in mood or  memory.        Current Meds  Medication Sig   amoxicillin-clavulanate (AUGMENTIN) 875-125 MG tablet Take 1 tablet by mouth 2 (two) times daily.   budesonide-formoterol (SYMBICORT) 160-4.5 MCG/ACT inhaler Inhale 2 puffs into the lungs. 1 puffs twice a day   EPINEPHrine (EPIPEN) 0.3 mg/0.3 mL IJ SOAJ injection Inject 0.3 mLs (0.3 mg total) into the muscle as needed.   famotidine (PEPCID) 20 MG tablet One after supper               Objective:     Wts  01/06/2024       180  05/15/21 190 lb (86.2 kg)  01/17/21 184 lb 9.6 oz (83.7 kg)  12/13/20 185 lb (83.9 kg)    Vital signs reviewed  01/06/2024  - Note at rest 02 sats  99% on RA   General appearance:    pleasant amb wm nad   HEENT : Oropharynx  clear      Nasal turbinates nl    NECK :  without  apparent JVD/ palpable Nodes/TM    LUNGS: no acc muscle use,  Nl contour chest which is clear to A and P bilaterally without cough on insp or exp maneuvers   CV:  RRR  no s3 or murmur or increase in P2, and no edema   ABD:  soft and nontender   MS:   Gait nl  ext warm without deformities Or obvious joint restrictions  calf tenderness, cyanosis or clubbing    SKIN: warm and dry without lesions    NEURO:  alert, approp, nl sensorium with  no motor or cerebellar deficits apparent.         Assessment

## 2024-01-06 ENCOUNTER — Ambulatory Visit: Admitting: Internal Medicine

## 2024-01-06 ENCOUNTER — Encounter: Payer: Self-pay | Admitting: Internal Medicine

## 2024-01-06 VITALS — BP 102/72 | HR 95 | Temp 98.2°F | Ht 73.0 in | Wt 180.6 lb

## 2024-01-06 DIAGNOSIS — K219 Gastro-esophageal reflux disease without esophagitis: Secondary | ICD-10-CM | POA: Insufficient documentation

## 2024-01-06 DIAGNOSIS — J4520 Mild intermittent asthma, uncomplicated: Secondary | ICD-10-CM

## 2024-01-06 MED ORDER — ALBUTEROL SULFATE HFA 108 (90 BASE) MCG/ACT IN AERS: INHALATION_SPRAY | RESPIRATORY_TRACT | Status: AC

## 2024-01-06 MED ORDER — BUDESONIDE-FORMOTEROL FUMARATE 80-4.5 MCG/ACT IN AERO: INHALATION_SPRAY | RESPIRATORY_TRACT | 12 refills | Status: DC

## 2024-01-06 NOTE — Patient Instructions (Addendum)
 My office will be contacting you by phone for referral to GI medicine re reflux   - if you don't hear back from my office within one week please call us back or notify us thru MyChart and we'll address it right away.   GERD (REFLUX)  is an extremely common cause of respiratory symptoms just like yours , many times with no obvious heartburn at all.    It can be treated with medication, but also with lifestyle changes including elevation of the head of your bed (ideally with 6 -8inch blocks under the headboard of your bed),  Smoking cessation, avoidance of late meals, excessive alcohol, and avoid fatty foods, chocolate, peppermint, colas, red wine, and acidic juices such as orange juice.  NO MINT OR MENTHOL PRODUCTS SO NO COUGH DROPS - LUDENs  USE SUGARLESS CANDY INSTEAD (Jolley ranchers or [employer]'s or Environmental manager) or even ice chips will also do - the key is to swallow to prevent all throat clearing. NO OIL BASED VITAMINS - use powdered substitutes.  Avoid fish oil when coughing.    Pulmonary follow up is as needed

## 2024-01-07 NOTE — Assessment & Plan Note (Addendum)
 Referred to GI 01/06/2024 >>>  Each maintenance medication was reviewed in detail including emphasizing most importantly the difference between maintenance and prns and under what circumstances the prns are to be triggered using an action plan format where appropriate.  Total time for H and P, chart review, counseling, reviewing hfa device(s) and generating customized AVS unique to this office visit / same day charting = 31 min with pt not seen in over 2 y

## 2024-01-07 NOTE — Assessment & Plan Note (Addendum)
 Onset in childhood  -  Added gerd rx 01/17/2021 to symbicort 160 2bid   - 01/06/2024 referred to GI for GERD and change symbicort  to the 80 strength up to 2 q 12   Very difficult to sort out post prandial gerd symptoms with LPR/ cough from asthma but the fact the symptoms don't consistently respond to symbicort 160 and don't wake him once asleep support LPR so > refer to GI at his request and try the lower strength symbicort if tol   Discussed in detail all the  indications, usual  risks and alternatives  relative to the benefits with patient who agrees to proceed with w/u as outlined.     F/u  q 1 m or let PCP refill symbicort 80 as needed  Comment : Based on two studies from [facility]  378; 20 p 1865 (2018) and 380 : p2020-30 (2019) in pts with mild asthma it is reasonable to use low dose symbicort eg 80 2bid "prn" flare in this setting but I emphasized this was only shown with symbicort and takes advantage of the rapid onset of action but is not the same as "rescue therapy" but can be stopped once the acute symptoms have resolved and the need for rescue has been minimized (< 2 x weekly)

## 2024-01-29 ENCOUNTER — Ambulatory Visit: Payer: Self-pay

## 2024-01-29 MED ORDER — BUDESONIDE-FORMOTEROL FUMARATE 160-4.5 MCG/ACT IN AERO: 2.0000 | INHALATION_SPRAY | Freq: Two times a day (BID) | RESPIRATORY_TRACT | 5 refills | Status: AC

## 2024-01-29 NOTE — Addendum Note (Signed)
 Addended by: Shelby Dubin on: 01/29/2024 03:52 PM   Modules accepted: Orders

## 2024-01-29 NOTE — Telephone Encounter (Signed)
 Rx sent to pharmacy. Patient aware. Nothing further needed at this time.

## 2024-01-29 NOTE — Telephone Encounter (Signed)
 Pt reports he has had an ongoing "asthma cough" at bedtime that has recently been worsening, pt notes he had to use his rescue inhaler last evening as he is having so much trouble sleeping due to cough but notes this often keeps him awake. Pt denies CP, SOB, fever. Requesting increased dosage of his Symbicort as he feels this would be more efficient in reducing his symptoms. Pharmacy verified. Please advise pt.        E2C2 Pulmonary Triage - Initial Assessment Questions "Chief Complaint (e.g., cough, sob, wheezing, fever, chills, sweat or additional symptoms) *Go to specific symptom protocol after initial questions. Worsening cough at night, occasional productive  "How long have symptoms been present?" X 4 months, recently worsening  Have you tested for COVID or Flu? Note: If not, ask patient if a home test can be taken. If so, instruct patient to call back for positive results. No  MEDICINES:   "Have you used any OTC meds to help with symptoms?"Yes If yes, ask "What medications?" Cough drops, tea, honey, no OTC  "Have you used your inhalers/maintenance medication?" Yes If yes, "What medications?" Symbicort Albuterol If inhaler, ask "How many puffs and how often?" Note: Review instructions on medication in the chart. 2 puffs at AM & HS 2 puffs at HS last night OXYGEN: "Do you wear supplemental oxygen?" No If yes, "How many liters are you supposed to use?" NA  "Do you monitor your oxygen levels?" No If yes, "What is your reading (oxygen level) today?" NA  "What is your usual oxygen saturation reading?"  (Note: Pulmonary O2 sats should be 90% or greater) NA   Copied from CRM [id number]. Topic: Clinical - Medication Question >> Jan 29, 2024  8:40 AM Alexander Allison wrote: Reason for CRM: Patient calling in following a cold to state that his asthma cough has worsened at night time. Patent requesting higher dosage for budesonide-formoterol (SYMBICORT) 80-4.5 MCG/ACT inhaler. Reason for  Disposition  [1] Caller has NON-URGENT medicine question about med that PCP prescribed AND [2] triager unable to answer question  Answer Assessment - Initial Assessment Questions 1. NAME of MEDICINE: "What medicine(s) are you calling about?"     Symbicort 2. QUESTION: "What is your question?" (e.g., double dose of medicine, side effect)     Increase dosage 3. PRESCRIBER: "Who prescribed the medicine?" Reason: if prescribed by specialist, call should be referred to that group.     Dr. Sherene Allison 4. SYMPTOMS: "Do you have any symptoms?" If Yes, ask: "What symptoms are you having?"  "How bad are the symptoms (e.g., mild, moderate, severe)     Worsening asthma cough at bedtime  Protocols used: Medication Question Call-A-AH

## 2024-01-29 NOTE — Telephone Encounter (Signed)
Dr. Melvyn Novas can you please advise

## 2024-01-29 NOTE — Telephone Encounter (Signed)
 Ok to go back to 160 rx Take 2 puffs first thing in am and then another 2 puffs about 12 hours later.

## 2024-02-03 ENCOUNTER — Other Ambulatory Visit: Payer: Self-pay | Admitting: Internal Medicine

## 2024-02-03 ENCOUNTER — Telehealth: Payer: Self-pay | Admitting: Internal Medicine

## 2024-02-03 MED ORDER — BUDESONIDE-FORMOTEROL FUMARATE 160-4.5 MCG/ACT IN AERO: 2.0000 | INHALATION_SPRAY | Freq: Two times a day (BID) | RESPIRATORY_TRACT | 12 refills | Status: AC

## 2024-02-03 NOTE — Telephone Encounter (Signed)
 Symbicort has been sent to preferred pharmacy.  Pt is aware and voiced his understanding.  Nothing further needed.

## 2024-02-03 NOTE — Addendum Note (Signed)
 Addended by: Katie Parks A on: 02/03/2024 04:04 PM   Modules accepted: Orders

## 2024-02-03 NOTE — Telephone Encounter (Signed)
 Pharm is [facility] on [address].  PT states we have not sent in his budesonide and he badly needs it.

## 2024-02-03 NOTE — Telephone Encounter (Signed)
 Copied from CRM [id number]. Topic: Clinical - Medication Question >> Jan 29, 2024  8:40 AM Hilton Lucky wrote: Reason for CRM: Patient calling in following a cold to state that his asthma cough has worsened at night time. Patent requesting higher dosage for budesonide-formoterol (SYMBICORT) 80-4.5 MCG/ACT inhaler. >> Feb 03, 2024  8:35 AM Ilene Malling wrote: Patient [phone] states inhaler SYMBICORT 160-4.5 MCG/ACT inhaler was not sent into [id number] Jonette Nestle, [place] - [address] AT [facility] [place] & [place] [address] Phone: [phone] Fax: [phone] last week. [facility] pharmacy does not have the medication. Please send name brand SYMBICORT 160-4.5 MCG/ACT inhaler, patient's insurance will not cover generic. Please call back.

## 2024-02-23 ENCOUNTER — Encounter: Payer: Self-pay | Admitting: Internal Medicine

## 2024-03-06 DIAGNOSIS — R062 Wheezing: Secondary | ICD-10-CM | POA: Diagnosis not present

## 2024-03-06 DIAGNOSIS — J209 Acute bronchitis, unspecified: Secondary | ICD-10-CM | POA: Diagnosis not present

## 2024-04-12 DIAGNOSIS — K219 Gastro-esophageal reflux disease without esophagitis: Secondary | ICD-10-CM | POA: Diagnosis not present

## 2024-04-12 DIAGNOSIS — R079 Chest pain, unspecified: Secondary | ICD-10-CM | POA: Diagnosis not present

## 2024-04-14 DIAGNOSIS — L728 Other follicular cysts of the skin and subcutaneous tissue: Secondary | ICD-10-CM | POA: Diagnosis not present

## 2024-04-14 DIAGNOSIS — L814 Other melanin hyperpigmentation: Secondary | ICD-10-CM | POA: Diagnosis not present

## 2024-04-14 DIAGNOSIS — D225 Melanocytic nevi of trunk: Secondary | ICD-10-CM | POA: Diagnosis not present

## 2024-08-04 DIAGNOSIS — J22 Unspecified acute lower respiratory infection: Secondary | ICD-10-CM | POA: Diagnosis not present

## 2024-09-06 DIAGNOSIS — J302 Other seasonal allergic rhinitis: Secondary | ICD-10-CM | POA: Diagnosis not present

## 2024-09-06 DIAGNOSIS — R0982 Postnasal drip: Secondary | ICD-10-CM | POA: Diagnosis not present

## 2024-09-06 DIAGNOSIS — R051 Acute cough: Secondary | ICD-10-CM | POA: Diagnosis not present

## 2024-09-06 DIAGNOSIS — J4520 Mild intermittent asthma, uncomplicated: Secondary | ICD-10-CM | POA: Diagnosis not present
